# Patient Record
Sex: Male | Born: 1960 | Race: White | Hispanic: No | Smoking: Former smoker
Health system: Southern US, Community
[De-identification: ages and names within clinical notes are randomized; demographics above are authoritative.]

## PROBLEM LIST (undated history)

## (undated) DIAGNOSIS — F1020 Alcohol dependence, uncomplicated: Secondary | ICD-10-CM

## (undated) DIAGNOSIS — F419 Anxiety disorder, unspecified: Secondary | ICD-10-CM

## (undated) DIAGNOSIS — J40 Bronchitis, not specified as acute or chronic: Secondary | ICD-10-CM

## (undated) DIAGNOSIS — I10 Essential (primary) hypertension: Secondary | ICD-10-CM

## (undated) DIAGNOSIS — F32A Depression, unspecified: Secondary | ICD-10-CM

## (undated) DIAGNOSIS — F329 Major depressive disorder, single episode, unspecified: Secondary | ICD-10-CM

## (undated) DIAGNOSIS — E785 Hyperlipidemia, unspecified: Secondary | ICD-10-CM

## (undated) HISTORY — PX: FRACTURE SURGERY: SHX138

## (undated) HISTORY — PX: ORIF PROXIMAL TIBIAL PLATEAU FRACTURE: SUR953

## (undated) HISTORY — PX: JOINT REPLACEMENT: SHX530

---
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
## 2007-08-02 ENCOUNTER — Inpatient Hospital Stay (HOSPITAL_COMMUNITY): Admission: EM | Admit: 2007-08-02 | Discharge: 2007-08-11 | Payer: Self-pay | Admitting: Emergency Medicine

## 2007-08-21 ENCOUNTER — Inpatient Hospital Stay (HOSPITAL_COMMUNITY): Admission: RE | Admit: 2007-08-21 | Discharge: 2007-08-23 | Payer: Self-pay | Admitting: Orthopedic Surgery

## 2007-09-12 ENCOUNTER — Encounter: Payer: Self-pay | Admitting: Orthopedic Surgery

## 2007-09-23 ENCOUNTER — Encounter: Payer: Self-pay | Admitting: Orthopedic Surgery

## 2007-10-21 ENCOUNTER — Encounter: Payer: Self-pay | Admitting: Orthopedic Surgery

## 2007-11-21 ENCOUNTER — Encounter: Payer: Self-pay | Admitting: Orthopedic Surgery
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
## 2011-01-04 NOTE — Op Note (Signed)
NAMEZACARY, BAUER             ACCOUNT NO.:  [id number]   MEDICAL RECORD NO.:  [id number]          PATIENT TYPE:  INP   LOCATION:  5030                         FACILITY:  [facility]   PHYSICIAN:  Doralee Albino. Carola Frost, M.D. DATE OF BIRTH:  October 27, 1960   DATE OF PROCEDURE:  08/09/2007  DATE OF DISCHARGE:                               OPERATIVE REPORT   PREOPERATIVE DIAGNOSIS:  Open left leg fasciotomy.   POSTOPERATIVE DIAGNOSIS:  Open left leg fasciotomy.   PROCEDURE:  Delayed layered closure of left leg wound, 18 cm.   SURGEON:  Doralee Albino. Carola Frost, M.D.   ASSISTANT:  Abel Presto, P.A.-C.   ANESTHESIA:  General.   COMPLICATIONS:  None.   ESTIMATED BLOOD LOSS:  Minimal.   DRAINS:  None.   DISPOSITION:  To the PACU.   CONDITION:  Stable.   INDICATIONS FOR PROCEDURE:  Mr. Jacquelyn Antony is well known to the  service and is status post spanning external fixation and compartment  release for a severe bicondylar tibial plateau fracture associated with  compartment syndrome.  He has undergone serial VAC treatment and wound  closure as his soft tissue have allowed.  He now returns to the OR for  possible definitive closure of his lateral fasciotomy.  We discussed,  preoperatively, the risks and benefits of the surgery including the  possibility of recurrent compartment syndrome, wound breakdown,  infection, nerve or vessel injury, need for further surgery among  others.  The patient understands these and wished to proceed.  He has  been on Lovenox for DVT prophylaxis since the injury.   DESCRIPTION OF PROCEDURE:  Mr. Santillana was administered preoperative  antibiotics and taken to the operating room where general anesthesia was  induced.  His left lower extremity was prepped and draped in the usual  sterile fashion.  The soft tissues did seem to have reduced swelling and  increased pliability.  We removed his old retention sutures, thoroughly  irrigated, no significant debridement was  required.  We then performed a  standard layered closure using 2-0 Vicryl and 3-0 simple nylon sutures.  A sterile gently compressive dressing was then applied.  The patient was  awakened from anesthesia and transported to the PACU in stable  condition.   PROGNOSIS:  Mr. Cropp sustained a severe injury to his left knee and  soft tissue envelope.  He still requires definitive internal fixation of  his fractures.  We will need to wait for an adequate period of soft  tissue swelling resolution before we can safely proceed.  At this time,  his skin does not wrinkle adequately to allow for safe  reconstruction.  We plan to see him back in one week to reassess and we  are hopeful to get him scheduled for definitive management of these  fractures.  He will remain on DVT prophylaxis with Lovenox pending  definitive fixation.      Doralee Albino. Carola Frost, M.D.  Electronically Signed     MHH/MEDQ  D:  08/09/2007  T:  08/09/2007  Job:  [id number]
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
## 2011-01-04 NOTE — Discharge Summary (Signed)
Jorge Vega, Jorge Vega NO.:  [id number]   MEDICAL RECORD NO.:  [id number]          PATIENT TYPE:  INP   LOCATION:  5030                         FACILITY:  [facility]   PHYSICIAN:  Mearl Latin, PA       DATE OF BIRTH:  [birth year]   DATE OF ADMISSION:  08/01/2007  DATE OF DISCHARGE:                               DISCHARGE SUMMARY   ADDENDUM:  [name] will follow up in the office in approximately one week, we  anticipate August 20, 2007.  We will reassess his soft tissue and  determine whether it is sufficiently healed to proceed with definitive  internal fixation of his left bicondylar tibial plateau fracture.  He  has been given prescriptions for pain which include Percocet and  oxycodone, in addition to the aforementioned Duricef for antibiosis and  Lovenox for DVT prophylaxis.  In addition, he is also given Robaxin for  muscle spasms.  Should the patient have any questions at any time or any  issues arise, he will contact the office immediately, and a card with  the office address and number has been given to the patient in addition  to wound care instruction sheet.      Mearl Latin, PA     KWP/MEDQ  D:  08/10/2007  T:  08/10/2007  Job:  [id number]
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
## 2011-01-04 NOTE — Op Note (Signed)
Jorge Vega, Jorge Vega             ACCOUNT NO.:  [id number]   MEDICAL RECORD NO.:  [id number]          PATIENT TYPE:  INP   LOCATION:  5030                         FACILITY:  [facility]   PHYSICIAN:  Doralee Albino. Carola Frost, M.D. DATE OF BIRTH:  September 29, 1960   DATE OF PROCEDURE:  08/06/2007  DATE OF DISCHARGE:                               OPERATIVE REPORT   PREOPERATIVE DIAGNOSIS:  Right open lateral fasciotomy.   POSTOPERATIVE DIAGNOSIS:  Right open lateral fasciotomy.   PROCEDURE:  1. Layered closure of 6 cm of wounds.  2. Application of Wound-Vac, 18 x 3 cm.   SURGEON:  Doralee Albino. Carola Frost, M.D.   ASSISTANT:  Mearl Latin, [place]   ANESTHESIA:  General.   DRAINS:  One medium Hemovac.   DISPOSITION:  PACU.   CONDITION:  Stable.   BRIEF SUMMARY OF INDICATIONS FOR PROCEDURE:  Jorge Vega is a 50 year old male status post severe bicondylar tibial  plateau fracture with compartment syndrome, who has been treated with  delayed primary closure of the medial fasciotomy and now returns to the  OR for possible closure of the lateral side versus partial closure  reapplication of wound vac.  The patient also has an associated foot  drop.  We discussed preoperatively the risks and benefits of surgery  including possibility of failure to close, need for split-thickness skin  grafting, need for subsequent procedures and others.  After full  discussion the patient wished to proceed, understanding these risks also  to include infection, nerve and vessel injury, DVT, PE and others.   DESCRIPTION OF PROCEDURE:  Jorge Vega was taken to the operating room  where general anesthesia was induced.  He did receive preoperative  antibiotics consisting of 2 grams of Ancef.  We thoroughly irrigated and  placed simple nylon suture into a posterior wound that appeared healthy  and without surrounding erythema.  It was closed loosely to allow for  adequate egress and drainage.  This wound was approximately 3 cm.   With  regard to the lateral fasciotomy, we also evacuated some old hematoma  and thoroughly irrigated.  We did look to see if the deep peroneal nerve  was readily visible and perhaps had been injured during the antecedent  anterior fasciotomy.  We did not find any evidence of injury on  evaluation of the anterior compartment.  The fascia had been split where  it could directly visualized, there was no deep penetration of the  muscle, and the deep peroneal nerve branch was not visible.  The muscle  remained pink, healthy in appearance, and contractile.  The superficial  peroneal nerve was again visualized and was definitely intact throughout  its course along the septum.  After irrigation, we attempted closure  beginning at the ends using 2-0 Vicryl.  We were only able to close  about 3 cm total, 1.5 proximally and 1.5 distally.  We did however place  multiple far near, near far sutures with 2-0 nylon to dramatically close  this space, still leaving a gap of 3 cm which remained about 18 cm in  length.  We then placed a  layer of Mepitel and the wound vac over this.  We also placed Mepitel over the other wound and blisters, and then  applied a gently compressive sterile dressing followed by the footplate  bringing the patient's ankle and great toe to neutral.  The patient was  awakened from anesthesia and transferred PACU in stable condition.   PROGNOSIS:  Jorge Vega will need to return to the OR perhaps at the end  of the week to possibly progress toward closure and/or apply a split-  thickness skin grafting.  He may be a candidate for further vac changes  with eventual closure next week as well.  This will certainly depend  upon his soft tissue mobility and edema resolution.  He remains at  increased risk for infection.  We will follow his nerve expectantly at  this time.      Doralee Albino. Carola Frost, M.D.  Electronically Signed     MHH/MEDQ  D:  08/07/2007  T:  08/07/2007  Job:   [id number]
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
## 2011-05-12 LAB — CBC
RBC: 3.29 — ABNORMAL LOW
RDW: 13.5
WBC: 8.9

## 2011-05-12 LAB — BASIC METABOLIC PANEL WITH GFR
BUN: 8
CO2: 30
Calcium: 9.3
Chloride: 103
Creatinine, Ser: 0.74
GFR calc non Af Amer: >60
Glucose, Bld: 101 — ABNORMAL HIGH
Potassium: 4
Sodium: 138

## 2011-05-27 LAB — BASIC METABOLIC PANEL
CO2: 30
Calcium: 8.2 — ABNORMAL LOW
Calcium: 9
Creatinine, Ser: 0.82
Creatinine, Ser: 0.87
GFR calc Af Amer: >60
GFR calc non Af Amer: >60
GFR calc non Af Amer: >60
Potassium: 3.2 — ABNORMAL LOW
Sodium: 134 — ABNORMAL LOW

## 2011-05-27 LAB — COMPREHENSIVE METABOLIC PANEL
AST: 42 — ABNORMAL HIGH
Albumin: 3.7
BUN: 12
Calcium: 9.3
Chloride: 104
Creatinine, Ser: 0.84
GFR calc Af Amer: >60
Total Protein: 6.7

## 2011-05-27 LAB — CBC
HCT: 26.1 — ABNORMAL LOW
HCT: 33.9 — ABNORMAL LOW
Hemoglobin: 9 — ABNORMAL LOW
MCHC: 33.6
MCHC: 34.6
MCV: 88.6
MCV: 89.2
Platelets: 257
Platelets: 527 — ABNORMAL HIGH
RBC: 2.93 — ABNORMAL LOW
RBC: 3.38 — ABNORMAL LOW
RDW: 12
RDW: 13.1
RDW: 13.4
WBC: 7.2
WBC: 8.2

## 2011-05-27 LAB — TYPE AND SCREEN
ABO/RH(D): O POS
Antibody Screen: NEGATIVE

## 2011-05-27 LAB — HEMOGLOBIN AND HEMATOCRIT, BLOOD
HCT: 25.9 — ABNORMAL LOW
HCT: 26.5 — ABNORMAL LOW
Hemoglobin: 9.1 — ABNORMAL LOW
Hemoglobin: 9.2 — ABNORMAL LOW

## 2011-05-27 LAB — DIFFERENTIAL
Basophils Absolute: 0.1
Eosinophils Relative: 2
Lymphocytes Relative: 29
Lymphs Abs: 2.1
Monocytes Absolute: 0.7
Monocytes Relative: 9
Neutro Abs: 4.2

## 2011-05-27 LAB — APTT: aPTT: 31

## 2011-05-30 LAB — CBC
HCT: 27.9 — ABNORMAL LOW
HCT: 37.3 — ABNORMAL LOW
Hemoglobin: 12.9 — ABNORMAL LOW
Hemoglobin: 9.7 — ABNORMAL LOW
MCHC: 34.9
MCV: 90.1
Platelets: 207
Platelets: 212
RBC: 4.19 — ABNORMAL LOW
RDW: 12.2
RDW: 12.6
WBC: 9.5

## 2011-05-30 LAB — URINALYSIS, ROUTINE W REFLEX MICROSCOPIC
Bilirubin Urine: NEGATIVE
Hgb urine dipstick: NEGATIVE
Ketones, ur: >80 — AB
Protein, ur: NEGATIVE
Urobilinogen, UA: 0.2

## 2011-05-30 LAB — COMPREHENSIVE METABOLIC PANEL
ALT: 15
AST: 29
AST: 30
Albumin: 3.4 — ABNORMAL LOW
BUN: 3 — ABNORMAL LOW
CO2: 28
Calcium: 8.2 — ABNORMAL LOW
Chloride: 103
Chloride: 103
Creatinine, Ser: 1.03
GFR calc Af Amer: >60
GFR calc Af Amer: >60
GFR calc non Af Amer: >60
Glucose, Bld: 127 — ABNORMAL HIGH
Potassium: 3.7
Potassium: 3.9
Sodium: 137
Sodium: 138
Sodium: 139
Total Bilirubin: 0.7
Total Bilirubin: 0.8
Total Protein: 6.1

## 2011-05-30 LAB — TYPE AND SCREEN: Antibody Screen: NEGATIVE

## 2011-05-30 LAB — BASIC METABOLIC PANEL
CO2: 23
Calcium: 8.9
GFR calc Af Amer: >60
GFR calc non Af Amer: >60
Potassium: 3.9
Sodium: 136

## 2011-05-30 LAB — HEMOGLOBIN AND HEMATOCRIT, BLOOD
HCT: 34.5 — ABNORMAL LOW
HCT: 35.9 — ABNORMAL LOW
Hemoglobin: 12 — ABNORMAL LOW
Hemoglobin: 12.3 — ABNORMAL LOW

## 2011-05-30 LAB — PROTIME-INR
INR: 1
Prothrombin Time: 13.7

## 2011-05-30 LAB — APTT: aPTT: 29

## 2012-03-23 ENCOUNTER — Ambulatory Visit: Payer: Self-pay | Admitting: Internal Medicine

## 2012-06-20 ENCOUNTER — Emergency Department (HOSPITAL_BASED_OUTPATIENT_CLINIC_OR_DEPARTMENT_OTHER)

## 2012-06-20 ENCOUNTER — Encounter (HOSPITAL_BASED_OUTPATIENT_CLINIC_OR_DEPARTMENT_OTHER): Payer: Self-pay | Admitting: *Deleted

## 2012-06-20 ENCOUNTER — Emergency Department (HOSPITAL_BASED_OUTPATIENT_CLINIC_OR_DEPARTMENT_OTHER)
Admission: EM | Admit: 2012-06-20 | Discharge: 2012-06-20 | Disposition: A | Discharge status: Home / Self Care | Attending: Emergency Medicine | Admitting: Emergency Medicine

## 2012-06-20 DIAGNOSIS — Z9660 Presence of unspecified orthopedic joint implant: Secondary | ICD-10-CM | POA: Insufficient documentation

## 2012-06-20 DIAGNOSIS — Y9269 Other specified industrial and construction area as the place of occurrence of the external cause: Secondary | ICD-10-CM | POA: Insufficient documentation

## 2012-06-20 DIAGNOSIS — S8990XA Unspecified injury of unspecified lower leg, initial encounter: Secondary | ICD-10-CM | POA: Insufficient documentation

## 2012-06-20 DIAGNOSIS — E785 Hyperlipidemia, unspecified: Secondary | ICD-10-CM | POA: Insufficient documentation

## 2012-06-20 DIAGNOSIS — W010XXA Fall on same level from slipping, tripping and stumbling without subsequent striking against object, initial encounter: Secondary | ICD-10-CM | POA: Insufficient documentation

## 2012-06-20 DIAGNOSIS — Y939 Activity, unspecified: Secondary | ICD-10-CM | POA: Insufficient documentation

## 2012-06-20 DIAGNOSIS — I10 Essential (primary) hypertension: Secondary | ICD-10-CM | POA: Insufficient documentation

## 2012-06-20 DIAGNOSIS — S62113A Displaced fracture of triquetrum [cuneiform] bone, unspecified wrist, initial encounter for closed fracture: Secondary | ICD-10-CM | POA: Insufficient documentation

## 2012-06-20 DIAGNOSIS — S99929A Unspecified injury of unspecified foot, initial encounter: Secondary | ICD-10-CM | POA: Insufficient documentation

## 2012-06-20 HISTORY — DX: Hyperlipidemia, unspecified: E78.5

## 2012-06-20 HISTORY — DX: Essential (primary) hypertension: I10

## 2012-06-20 MED ORDER — HYDROCODONE-ACETAMINOPHEN 5-325 MG PO TABS: 2.0000 | ORAL_TABLET | ORAL | Status: DC | PRN

## 2012-06-20 MED ORDER — ACETAMINOPHEN 500 MG PO TABS
1000.0000 mg | ORAL_TABLET | Freq: Once | ORAL | Status: AC
  Administered 2012-06-20: 1000 mg via ORAL
  Filled 2012-06-20: qty 2

## 2012-06-20 NOTE — ED Provider Notes (Signed)
History     CSN: [id number]  Arrival date & time 06/20/12  1348   First MD Initiated Contact with Patient 06/20/12 1407      Chief Complaint  Patient presents with  . Fall    (Consider location/radiation/quality/duration/timing/severity/associated sxs/prior treatment) HPI Comments: Patient is a 51 year old male who presents with left wrist pain and right knee injury after falling on a tile floor earlier today. The patient reports tripping over something on the ground causing his fall. He denies head injury or LOC. Patient currently complaining of only left wrist pain. The pain started immediately after his fall as he braced his fall with his left hand. The pain is throbbing and severe and localized to his left wrist without radiation. Patient reports associated swelling. Patient did not try anything for pain relief. Nothing makes the pain better. Wrist movement makes the pain worse. Patient denies headache, visual changes, chest pain, SOB, abdominal pain, NVD, numbness/tingling of hand, bruising, wound, pallor/coolness of affected extremity.   Patient is a 51 y.o. male presenting with fall.  Fall    Past Medical History  Diagnosis Date  . Hypertension   . Hyperlipemia     Past Surgical History  Procedure Date  . Joint replacement     History reviewed. No pertinent family history.  History  Substance Use Topics  . Smoking status: Never Smoker   . Smokeless tobacco: Not on file  . Alcohol Use: No      Review of Systems  Musculoskeletal: Positive for joint swelling and arthralgias.  All other systems reviewed and are negative.    Allergies  Review of patient's allergies indicates no known allergies.  Home Medications  No current outpatient prescriptions on file.  BP 115/81  Pulse 84  Temp 99.1 F (37.3 C) (Oral)  Resp 16  Ht 6\' 1"  (1.854 m)  Wt 240 lb (108.863 kg)  BMI 31.66 kg/m2  SpO2 100%  Physical Exam  Nursing note and vitals  reviewed. Constitutional: He is oriented to person, place, and time. He appears well-developed and well-nourished. No distress.  HENT:  Head: Normocephalic and atraumatic.  Eyes: Conjunctivae normal and EOM are normal. Pupils are equal, round, and reactive to light. No scleral icterus.  Neck: Normal range of motion. Neck supple.  Cardiovascular: Normal rate and regular rhythm.  Exam reveals no gallop and no friction rub.   No murmur heard. Pulmonary/Chest: Effort normal and breath sounds normal. He has no wheezes. He has no rales. He exhibits no tenderness.  Abdominal: Soft. There is no tenderness.  Musculoskeletal: Normal range of motion.       Mild edema and tenderness to palpation over ulnar aspect of left wrist. ROM of left wrist limited due to pain. No open wound, obvious deformity, bruising noted. Right knee has full ROM without deformity, edema, open wound. No tenderness to palpation.   Neurological: He is alert and oriented to person, place, and time. Coordination normal.       Strength and sensation equal and intact bilaterally. Speech is goal-oriented. Moves limbs without ataxia.   Skin: Skin is warm and dry. He is not diaphoretic.  Psychiatric: He has a normal mood and affect. His behavior is normal.    ED Course  Procedures (including critical care time)  Labs Reviewed - No data to display Dg Wrist Complete Left  06/20/2012  *RADIOLOGY REPORT*  Clinical Data: Larey Seat.  Pain.  LEFT WRIST - COMPLETE 3+ VIEW  Comparison: None.  Findings: There is  a triquetral fracture with fragments evident dorsal to the carpus on the lateral view.  No other regional fracture.  IMPRESSION: Triquetral fracture.   Original Report Authenticated By: Thomasenia Sales, M.D.    Dg Knee Complete 4 Views Right  06/20/2012  *RADIOLOGY REPORT*  Clinical Data: Larey Seat.  Pain.  RIGHT KNEE - COMPLETE 4+ VIEW  Comparison: None.  Findings: There may be a small joint effusion.  No visible fracture.  No significant  degenerative change.  IMPRESSION: Question small joint effusion.  Otherwise negative.   Original Report Authenticated By: Thomasenia Sales, M.D.      1. Triquetral fracture       MDM  3:09 PM Patient's xrays show a left wrist triquetral fracture. No signs of neurovascular compromise. Associated swelling. Patient will have tylenol for pain and splint applied here. Patient will have Norco prescription and follow up with Dr. Melvyn Novas of [facility]. No further evaluation needed here at this time.         Emilia Beck, PA-C 06/20/12 1536

## 2012-06-20 NOTE — ED Notes (Signed)
Pt c/o fall from standing landing on tile floor injuring left wrist and right knee

## 2012-06-21 NOTE — ED Provider Notes (Signed)
Medical screening examination/treatment/procedure(s) were performed by non-physician practitioner and as supervising physician I was immediately available for consultation/collaboration.  Geoffery Lyons, MD 06/21/12 [phone]

## 2012-06-26 ENCOUNTER — Ambulatory Visit: Payer: Self-pay | Admitting: Internal Medicine

## 2012-08-08 LAB — CBC
MCH: 29.6 pg (ref 26.0–34.0)
MCHC: 34.6 g/dL (ref 32.0–36.0)
Platelet: 261 10*3/uL (ref 150–440)
RDW: 12.8 % (ref 11.5–14.5)
WBC: 15 10*3/uL — ABNORMAL HIGH (ref 3.8–10.6)

## 2012-08-08 LAB — CK TOTAL AND CKMB (NOT AT ARMC)
CK, Total: 93 U/L (ref 35–232)
CK-MB: 0.6 ng/mL (ref 0.5–3.6)

## 2012-08-08 LAB — URINALYSIS, COMPLETE
Blood: NEGATIVE
Glucose,UR: NEGATIVE mg/dL (ref 0–75)
Nitrite: NEGATIVE
Ph: 6 (ref 4.5–8.0)
Protein: NEGATIVE
Specific Gravity: 1.012 (ref 1.003–1.030)
WBC UR: NONE SEEN /HPF (ref 0–5)

## 2012-08-08 LAB — COMPREHENSIVE METABOLIC PANEL
Alkaline Phosphatase: 104 U/L (ref 50–136)
Calcium, Total: 8.9 mg/dL (ref 8.5–10.1)
Co2: 20 mmol/L — ABNORMAL LOW (ref 21–32)
Creatinine: 1.17 mg/dL (ref 0.60–1.30)
EGFR (Non-African Amer.): >60
SGOT(AST): 37 U/L (ref 15–37)
SGPT (ALT): 49 U/L (ref 12–78)
Sodium: 121 mmol/L — ABNORMAL LOW (ref 136–145)

## 2012-08-08 LAB — LITHIUM LEVEL: Lithium: 1.03 mmol/L

## 2012-08-09 ENCOUNTER — Inpatient Hospital Stay: Payer: Self-pay | Admitting: Internal Medicine

## 2012-08-09 LAB — ETHANOL
Ethanol %: <0.003 % (ref 0.000–0.080)
Ethanol: <3 mg/dL

## 2012-08-09 LAB — DRUG SCREEN, URINE
Amphetamines, Ur Screen: NEGATIVE (ref ?–1000)
Benzodiazepine, Ur Scrn: NEGATIVE (ref ?–200)
Cannabinoid 50 Ng, Ur ~~LOC~~: NEGATIVE (ref ?–50)
MDMA (Ecstasy)Ur Screen: NEGATIVE (ref ?–500)
Methadone, Ur Screen: NEGATIVE (ref ?–300)

## 2012-08-09 LAB — ELECTROLYTE PANEL
Chloride: 100 mmol/L (ref 98–107)
Co2: 22 mmol/L (ref 21–32)
Sodium: 128 mmol/L — ABNORMAL LOW (ref 136–145)

## 2012-08-09 LAB — MAGNESIUM: Magnesium: 2.4 mg/dL

## 2013-11-26 ENCOUNTER — Ambulatory Visit: Payer: Self-pay | Admitting: Gastroenterology

## 2013-11-27 LAB — PATHOLOGY REPORT

## 2014-05-01 ENCOUNTER — Emergency Department: Payer: Self-pay | Admitting: Emergency Medicine

## 2014-05-01 LAB — DRUG SCREEN, URINE
Amphetamines, Ur Screen: NEGATIVE (ref ?–1000)
BARBITURATES, UR SCREEN: NEGATIVE (ref ?–200)
Benzodiazepine, Ur Scrn: NEGATIVE (ref ?–200)
CANNABINOID 50 NG, UR ~~LOC~~: NEGATIVE (ref ?–50)
Cocaine Metabolite,Ur ~~LOC~~: NEGATIVE (ref ?–300)
MDMA (ECSTASY) UR SCREEN: NEGATIVE (ref ?–500)
METHADONE, UR SCREEN: NEGATIVE (ref ?–300)
OPIATE, UR SCREEN: NEGATIVE (ref ?–300)
PHENCYCLIDINE (PCP) UR S: NEGATIVE (ref ?–25)
TRICYCLIC, UR SCREEN: NEGATIVE (ref ?–1000)

## 2014-05-01 LAB — URINALYSIS, COMPLETE
BILIRUBIN, UR: NEGATIVE
BLOOD: NEGATIVE
Bacteria: NONE SEEN
GLUCOSE, UR: NEGATIVE mg/dL (ref 0–75)
KETONE: NEGATIVE
LEUKOCYTE ESTERASE: NEGATIVE
NITRITE: NEGATIVE
PH: 5 (ref 4.5–8.0)
PROTEIN: NEGATIVE
RBC,UR: <1 /HPF (ref 0–5)
Specific Gravity: 1.011 (ref 1.003–1.030)
Squamous Epithelial: NONE SEEN

## 2014-05-01 LAB — SALICYLATE LEVEL

## 2014-05-01 LAB — COMPREHENSIVE METABOLIC PANEL
ALK PHOS: 65 U/L
ALT: 30 U/L
ANION GAP: 8 (ref 7–16)
Albumin: 4.7 g/dL (ref 3.4–5.0)
BILIRUBIN TOTAL: 0.7 mg/dL (ref 0.2–1.0)
BUN: 17 mg/dL (ref 7–18)
Calcium, Total: 9.6 mg/dL (ref 8.5–10.1)
Chloride: 97 mmol/L — ABNORMAL LOW (ref 98–107)
Co2: 25 mmol/L (ref 21–32)
Creatinine: 0.96 mg/dL (ref 0.60–1.30)
EGFR (African American): >60
Glucose: 108 mg/dL — ABNORMAL HIGH (ref 65–99)
Osmolality: 263 (ref 275–301)
Potassium: 3.9 mmol/L (ref 3.5–5.1)
SGOT(AST): 34 U/L (ref 15–37)
Sodium: 130 mmol/L — ABNORMAL LOW (ref 136–145)
Total Protein: 9 g/dL — ABNORMAL HIGH (ref 6.4–8.2)

## 2014-05-01 LAB — CBC
HCT: 38.8 % — ABNORMAL LOW (ref 40.0–52.0)
HGB: 12.8 g/dL — ABNORMAL LOW (ref 13.0–18.0)
MCH: 29.6 pg (ref 26.0–34.0)
MCHC: 33.1 g/dL (ref 32.0–36.0)
MCV: 90 fL (ref 80–100)
Platelet: 296 10*3/uL (ref 150–440)
RBC: 4.33 10*6/uL — ABNORMAL LOW (ref 4.40–5.90)
RDW: 13.2 % (ref 11.5–14.5)
WBC: 10 10*3/uL (ref 3.8–10.6)

## 2014-05-01 LAB — ACETAMINOPHEN LEVEL: Acetaminophen: <2 ug/mL

## 2014-05-01 LAB — ETHANOL

## 2014-12-09 NOTE — H&P (Signed)
PATIENT NAME:  Jorge MaresBRADLEY, [name] MR#:  [id number] DATE OF BIRTH:  11-04-1960  DATE OF ADMISSION:  08/09/2012  PRIMARY CARE PHYSICIAN: Dr. Yates DecampJohn Walker, III.   REFERRING PHYSICIAN: Dr. Governor Rooksebecca Lord.   CHIEF COMPLAINT: Generalized weakness, ataxia and blurred vision.   HISTORY OF PRESENT ILLNESS: The patient is a 54 year old Caucasian male with history of systemic hypertension, hyperlipidemia and chronic alcoholism. The patient states that 5 days ago on Friday, he started to feel generalized weakness and out of balance when he stood up and walked, associated with some blurring of vision and lack of energy. At that time, he started to cut down drinking alcohol. His usual is 1 pint and a half of vodka every night. His last drink was on Sunday. He had 1 drink. The last couple of days, he could not sleep much at night. He decided to come to the hospital for evaluation. Here, he was found to have orthostatic changes in his heart rate, but no changes in his blood pressure. He is dehydrated and there is evidence of hyponatremia. The patient was admitted for further evaluation and management.   REVIEW OF SYSTEMS:   CONSTITUTIONAL: Denies having any fever. No chills, but reports being fatigued.  EYES: Reports blurring of vision that is occasional, especially upon standing or walking. No double vision.  ENT: No hearing impairment. No sore throat. No dysphagia.  CARDIOVASCULAR: No chest pain. No shortness of breath. No edema. No syncope.  RESPIRATORY: No cough. No sputum production. No shortness of breath. No chest pain.  GASTROINTESTINAL: No abdominal pain. No vomiting. No diarrhea.  GENITOURINARY: No dysuria. No frequency of urination.  MUSCULOSKELETAL: No joint pain or swelling. No muscular pain or swelling.  INTEGUMENTARY: No skin rash. No ulcers.  NEUROLOGY: No focal weakness. No seizure activity. No headache, but he has mild ataxia upon walking.  ENDOCRINE: No polyuria or polydipsia. No heat or cold  intolerance.  HEMATOLOGY: No easy bruisability. No lymph node enlargement.   PAST MEDICAL HISTORY: Systemic hypertension, hyperlipidemia, depression and chronic alcoholism.   SOCIAL HISTORY: He is single and divorced. 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   FAMILY HISTORY: His father had lung cancer. His mother is alive and she is healthy.   ADMISSION MEDICATIONS: He does not know the name of his medications, but from his records he is on lisinopril with hydrochlorothiazide 20/25 once a day, gemfibrozil 600 mg twice a day and B12 1000 mcg injection once a month. He also appears to be on lithium twice a day,  but he does not know the dose and his records does not show the dose of his lithium.   ALLERGIES: No known drug allergies.   PHYSICAL EXAMINATION:  VITAL SIGNS: Blood pressure 114/67, respiratory rate 20, pulse 79, temperature 98.5, oxygen saturation 99%.  GENERAL APPEARANCE: Middle-aged male laying in bed in no acute distress.  HEAD AND NECK: No pallor. No icterus. No cyanosis. Ears: Normal hearing. No discharge. No lesions. Nasal mucosa was normal without lesions. No discharge. Oropharyngeal area showed no ulcers and no oral thrush. Eyes: Normal iris and conjunctivae. Pupils are about 5 mm, round, equal, and reactive to light.  NECK: Supple. Trachea at midline. No thyromegaly. No cervical lymphadenopathy. No masses.  HEART: Normal S1, S2. No S3, S4. No murmur. No gallop. No carotid bruits.  LUNGS: Normal breathing pattern without use of accessory muscles. No rales. No wheezing.  ABDOMEN: Soft without tenderness. No hepatosplenomegaly. No masses. No hernias.  SKIN: No ulcers. No subcutaneous  nodules.  MUSCULOSKELETAL: No joint swelling. No clubbing.  NEUROLOGIC: Cranial nerves II through XII are intact. No focal motor deficit. Coordination movements were normal. Sensation was intact.  PSYCHIATRIC: The patient is alert and oriented x 3. Mood and affect were normal.   LABORATORY  FINDINGS: EKG showed normal sinus rhythm at rate of 72 per minute. CAT scan of the head showed no acute intracranial abnormality. Chest x-ray showed no effusion and no consolidation. Blood workup showed glucose of 100, BUN 40, creatinine 1.1, sodium 121 and potassium 4.3. Normal liver function tests and liver transaminases. Troponin less than 0.02. Lithium level was 1.03. Drug screen was negative. CBC showed elevated white blood cell count at 15,000, hemoglobin 12, hematocrit 35 and platelet count 261. Urinalysis was negative.   ASSESSMENT:  1.  Generalized weakness. 2.  Ataxia.  3.  Dehydration with elevated BUN reaching 40 over a  creatinine of 1.1.  4.  Hyponatremia.  5.  Orthostatic changes in heart rate, but no orthostatic changes in blood pressure.  6.  Systemic hypertension.  7.  Leukocytosis.  8.  Hyperlipidemia.  9.  Depression.  10. Chronic alcoholism.   PLAN: We will start the patient on IV hydration using normal saline to correct his dehydration and postural orthostatic changes. We will follow up on his sodium on repeat basic metabolic profile. We will also repeat CBC tomorrow. Thiamine and multivitamin supplementation. Watch for any withdrawal symptoms. He is right now more than 48 hours since his last drink. I placed him on CIWA protocol.   TIME SPENT IN EVALUATING THIS PATIENT: Took more than 55 minutes.   ____________________________ Carney Corners. Rudene Re, MD amd:aw D: 08/09/2012 02:14:31 ET T: 08/09/2012 08:10:05 ET JOB#: [id number]  cc: Carney Corners. Rudene Re, MD, <Dictator> Zollie Scale MD ELECTRONICALLY SIGNED 08/13/2012 21:15

## 2019-03-19 ENCOUNTER — Telehealth: Payer: Self-pay | Admitting: Urology

## 2019-03-19 NOTE — Telephone Encounter (Signed)
Left patient a message to return call-there is no sooner new patient appointments.

## 2019-03-19 NOTE — Telephone Encounter (Signed)
Pt LMOM and would like to see Dr Bernardo Heater before his appt on 04/18/2019, there are not any New Pt appts available. Please advise.

## 2019-04-11 ENCOUNTER — Other Ambulatory Visit (HOSPITAL_COMMUNITY): Payer: Self-pay | Admitting: Sports Medicine

## 2019-04-11 ENCOUNTER — Other Ambulatory Visit: Payer: Self-pay | Admitting: Sports Medicine

## 2019-04-11 DIAGNOSIS — S46011A Strain of muscle(s) and tendon(s) of the rotator cuff of right shoulder, initial encounter: Secondary | ICD-10-CM

## 2019-04-11 DIAGNOSIS — M25511 Pain in right shoulder: Secondary | ICD-10-CM

## 2019-04-18 ENCOUNTER — Ambulatory Visit: Payer: Self-pay | Admitting: Urology

## 2019-04-19 ENCOUNTER — Other Ambulatory Visit (HOSPITAL_COMMUNITY): Payer: Self-pay | Admitting: Sports Medicine

## 2019-04-19 DIAGNOSIS — S46011A Strain of muscle(s) and tendon(s) of the rotator cuff of right shoulder, initial encounter: Secondary | ICD-10-CM

## 2019-04-19 DIAGNOSIS — M25511 Pain in right shoulder: Secondary | ICD-10-CM

## 2019-05-03 ENCOUNTER — Ambulatory Visit (HOSPITAL_COMMUNITY)
Admission: RE | Admit: 2019-05-03 | Discharge: 2019-05-03 | Disposition: A | Discharge status: Home / Self Care | Payer: Federal, State, Local not specified - PPO | Source: Ambulatory Visit | Attending: Sports Medicine | Admitting: Sports Medicine

## 2019-05-03 ENCOUNTER — Other Ambulatory Visit: Payer: Self-pay

## 2019-05-03 DIAGNOSIS — M25511 Pain in right shoulder: Secondary | ICD-10-CM | POA: Insufficient documentation

## 2019-05-03 DIAGNOSIS — S46011A Strain of muscle(s) and tendon(s) of the rotator cuff of right shoulder, initial encounter: Secondary | ICD-10-CM | POA: Diagnosis present

## 2019-05-10 ENCOUNTER — Inpatient Hospital Stay: Admission: RE | Admit: 2019-05-10 | Payer: Federal, State, Local not specified - PPO | Source: Ambulatory Visit

## 2019-05-14 ENCOUNTER — Other Ambulatory Visit: Admission: RE | Admit: 2019-05-14 | Payer: Federal, State, Local not specified - PPO | Source: Ambulatory Visit

## 2019-05-28 ENCOUNTER — Other Ambulatory Visit: Payer: Self-pay

## 2019-05-28 ENCOUNTER — Other Ambulatory Visit
Admission: RE | Admit: 2019-05-28 | Discharge: 2019-05-28 | Disposition: A | Discharge status: Home / Self Care | Payer: Federal, State, Local not specified - PPO | Source: Ambulatory Visit | Attending: Orthopedic Surgery | Admitting: Orthopedic Surgery

## 2019-05-28 DIAGNOSIS — Z01810 Encounter for preprocedural cardiovascular examination: Secondary | ICD-10-CM

## 2019-05-28 DIAGNOSIS — Z01818 Encounter for other preprocedural examination: Secondary | ICD-10-CM | POA: Diagnosis present

## 2019-05-28 DIAGNOSIS — M75101 Unspecified rotator cuff tear or rupture of right shoulder, not specified as traumatic: Secondary | ICD-10-CM | POA: Diagnosis not present

## 2019-05-28 DIAGNOSIS — Z20828 Contact with and (suspected) exposure to other viral communicable diseases: Secondary | ICD-10-CM | POA: Diagnosis not present

## 2019-05-28 DIAGNOSIS — I10 Essential (primary) hypertension: Secondary | ICD-10-CM | POA: Diagnosis not present

## 2019-05-28 HISTORY — DX: Anxiety disorder, unspecified: F41.9

## 2019-05-28 HISTORY — DX: Depression, unspecified: F32.A

## 2019-05-28 HISTORY — DX: Alcohol dependence, uncomplicated: F10.20

## 2019-05-28 HISTORY — DX: Bronchitis, not specified as acute or chronic: J40

## 2019-05-28 LAB — BASIC METABOLIC PANEL
Anion gap: 12 (ref 5–15)
BUN: 15 mg/dL (ref 6–20)
CO2: 23 mmol/L (ref 22–32)
Calcium: 9.9 mg/dL (ref 8.9–10.3)
Chloride: 102 mmol/L (ref 98–111)
Creatinine, Ser: 0.85 mg/dL (ref 0.61–1.24)
GFR calc Af Amer: >60 mL/min (ref >60)
GFR calc non Af Amer: >60 mL/min (ref >60)
Glucose, Bld: 103 mg/dL — ABNORMAL HIGH (ref 70–99)
Potassium: 4.2 mmol/L (ref 3.5–5.1)
Sodium: 137 mmol/L (ref 135–145)

## 2019-05-28 LAB — CBC
HCT: 39.1 % (ref 39.0–52.0)
Hemoglobin: 13.1 g/dL (ref 13.0–17.0)
MCH: 30.4 pg (ref 26.0–34.0)
MCHC: 33.5 g/dL (ref 30.0–36.0)
MCV: 90.7 fL (ref 80.0–100.0)
Platelets: 310 10*3/uL (ref 150–400)
RBC: 4.31 MIL/uL (ref 4.22–5.81)
RDW: 12.5 % (ref 11.5–15.5)
WBC: 8 10*3/uL (ref 4.0–10.5)
nRBC: 0 % (ref 0.0–0.2)

## 2019-05-28 NOTE — Patient Instructions (Signed)
Your procedure is scheduled on: Friday, May 31, 2019 Report to Day Surgery on the 2nd floor of the [facility]. To find out your arrival time, please call [phone] between 1PM - 3PM on: Thursday, October 8  REMEMBER: Instructions that are not followed completely may result in serious medical risk, up to and including death; or upon the discretion of your surgeon and anesthesiologist your surgery may need to be rescheduled.  Do not eat food after midnight the night before surgery.  No gum chewing, lozengers or hard candies.  You may however, drink CLEAR liquids up to 2 hours before you are scheduled to arrive for your surgery. Do not drink anything within 2 hours of the start of your surgery.  Clear liquids include: - water  - apple juice without pulp - gatorade - black coffee or tea (Do NOT add milk or creamers to the coffee or tea) Do NOT drink anything that is not on this list.  No Alcohol for 24 hours before or after surgery.  No Smoking including e-cigarettes for 24 hours prior to surgery.  No chewable tobacco products for at least 6 hours prior to surgery.  No nicotine patches on the day of surgery.  On the morning of surgery brush your teeth with toothpaste and water, you may rinse your mouth with mouthwash if you wish. Do not swallow any toothpaste or mouthwash.  Notify your doctor if there is any change in your medical condition (cold, fever, infection).  Do not wear jewelry, make-up, hairpins, clips or nail polish.  Do not wear lotions, powders, or perfumes.   Do not shave 48 hours prior to surgery.   Contacts and dentures may not be worn into surgery.  Do not bring valuables to the hospital, including drivers license, insurance or credit cards.  [facility] is not responsible for any belongings or valuables.   TAKE THESE MEDICATIONS THE MORNING OF SURGERY:  1.  Tramadol if needed for pain  Use CHG Soap as directed on instruction sheet.  NOW!  Stop  Anti-inflammatories (NSAIDS) such as Advil, Aleve, Ibuprofen, Motrin, Naproxen, Naprosyn and Aspirin based products such as Excedrin, Goodys Powder, BC Powder. (May take Tylenol or Acetaminophen if needed.)  NOW!  Stop ANY OVER THE COUNTER supplements until after surgery. (May continue Vitamin B, and multivitamin.)  Wear comfortable clothing (specific to your surgery type) to the hospital.  If you are being discharged the day of surgery, you will not be allowed to drive home. You will need a responsible adult to drive you home and stay with you that night.   If you are taking public transportation, you will need to have a responsible adult with you. Please confirm with your physician that it is acceptable to use public transportation.   Please call [phone] if you have any questions about these instructions.

## 2019-05-29 LAB — SARS CORONAVIRUS 2 (TAT 6-24 HRS): SARS Coronavirus 2: NEGATIVE

## 2019-05-30 MED ORDER — DEXTROSE 5 % IV SOLN
3.0000 g | Freq: Once | INTRAVENOUS | Status: AC
  Administered 2019-05-31: 08:00:00 3 g via INTRAVENOUS
  Filled 2019-05-30: qty 3

## 2019-05-31 ENCOUNTER — Encounter: Payer: Self-pay | Admitting: Anesthesiology

## 2019-05-31 ENCOUNTER — Ambulatory Visit: Payer: Federal, State, Local not specified - PPO

## 2019-05-31 ENCOUNTER — Ambulatory Visit
Admission: RE | Admit: 2019-05-31 | Discharge: 2019-05-31 | Disposition: A | Discharge status: Home / Self Care | Payer: Federal, State, Local not specified - PPO | Attending: Orthopedic Surgery | Admitting: Orthopedic Surgery

## 2019-05-31 ENCOUNTER — Ambulatory Visit: Payer: Federal, State, Local not specified - PPO | Admitting: Anesthesiology

## 2019-05-31 ENCOUNTER — Encounter
Admission: RE | Disposition: A | Discharge status: Home / Self Care | Payer: Self-pay | Source: Home / Self Care | Attending: Orthopedic Surgery

## 2019-05-31 ENCOUNTER — Other Ambulatory Visit: Payer: Self-pay

## 2019-05-31 DIAGNOSIS — M25811 Other specified joint disorders, right shoulder: Secondary | ICD-10-CM | POA: Insufficient documentation

## 2019-05-31 DIAGNOSIS — S46011A Strain of muscle(s) and tendon(s) of the rotator cuff of right shoulder, initial encounter: Secondary | ICD-10-CM | POA: Diagnosis not present

## 2019-05-31 DIAGNOSIS — Z87891 Personal history of nicotine dependence: Secondary | ICD-10-CM | POA: Insufficient documentation

## 2019-05-31 DIAGNOSIS — I10 Essential (primary) hypertension: Secondary | ICD-10-CM | POA: Insufficient documentation

## 2019-05-31 DIAGNOSIS — M65811 Other synovitis and tenosynovitis, right shoulder: Secondary | ICD-10-CM | POA: Diagnosis not present

## 2019-05-31 DIAGNOSIS — M19111 Post-traumatic osteoarthritis, right shoulder: Secondary | ICD-10-CM | POA: Diagnosis not present

## 2019-05-31 DIAGNOSIS — M7521 Bicipital tendinitis, right shoulder: Secondary | ICD-10-CM | POA: Diagnosis not present

## 2019-05-31 DIAGNOSIS — S43431A Superior glenoid labrum lesion of right shoulder, initial encounter: Secondary | ICD-10-CM | POA: Diagnosis not present

## 2019-05-31 DIAGNOSIS — W108XXA Fall (on) (from) other stairs and steps, initial encounter: Secondary | ICD-10-CM | POA: Diagnosis not present

## 2019-05-31 DIAGNOSIS — E785 Hyperlipidemia, unspecified: Secondary | ICD-10-CM | POA: Insufficient documentation

## 2019-05-31 HISTORY — PX: SHOULDER ARTHROSCOPY WITH ROTATOR CUFF REPAIR AND SUBACROMIAL DECOMPRESSION: SHX5686

## 2019-05-31 SURGERY — SHOULDER ARTHROSCOPY WITH ROTATOR CUFF REPAIR AND SUBACROMIAL DECOMPRESSION
Anesthesia: General | Site: Shoulder | Laterality: Right

## 2019-05-31 MED ORDER — BUPIVACAINE LIPOSOME 1.3 % IJ SUSP
INTRAMUSCULAR | Status: AC
  Filled 2019-05-31: qty 20

## 2019-05-31 MED ORDER — ASPIRIN EC 325 MG PO TBEC: 325.0000 mg | DELAYED_RELEASE_TABLET | Freq: Every day | ORAL | 0 refills | Status: AC

## 2019-05-31 MED ORDER — EPINEPHRINE PF 1 MG/ML IJ SOLN
INTRAMUSCULAR | Status: AC
  Filled 2019-05-31: qty 1

## 2019-05-31 MED ORDER — BUPIVACAINE HCL (PF) 0.5 % IJ SOLN
INTRAMUSCULAR | Status: AC
  Filled 2019-05-31: qty 10

## 2019-05-31 MED ORDER — LABETALOL HCL 5 MG/ML IV SOLN
INTRAVENOUS | Status: AC
  Filled 2019-05-31: qty 4

## 2019-05-31 MED ORDER — SUGAMMADEX SODIUM 500 MG/5ML IV SOLN
INTRAVENOUS | Status: DC | PRN
  Administered 2019-05-31: 300 mg via INTRAVENOUS

## 2019-05-31 MED ORDER — EPHEDRINE SULFATE 50 MG/ML IJ SOLN
INTRAMUSCULAR | Status: AC
  Filled 2019-05-31: qty 1

## 2019-05-31 MED ORDER — OXYCODONE HCL 5 MG PO TABS: 5.0000 mg | ORAL_TABLET | ORAL | 0 refills | Status: AC | PRN

## 2019-05-31 MED ORDER — FENTANYL CITRATE (PF) 100 MCG/2ML IJ SOLN
50.0000 ug | Freq: Once | INTRAMUSCULAR | Status: AC
  Administered 2019-05-31: 50 ug via INTRAVENOUS

## 2019-05-31 MED ORDER — LIDOCAINE HCL (PF) 1 % IJ SOLN
INTRAMUSCULAR | Status: AC
  Filled 2019-05-31: qty 5

## 2019-05-31 MED ORDER — LIDOCAINE-EPINEPHRINE 1 %-1:100000 IJ SOLN
INTRAMUSCULAR | Status: AC
  Filled 2019-05-31: qty 1

## 2019-05-31 MED ORDER — DEXAMETHASONE SODIUM PHOSPHATE 10 MG/ML IJ SOLN
INTRAMUSCULAR | Status: DC | PRN
  Administered 2019-05-31: 4 mg via INTRAVENOUS

## 2019-05-31 MED ORDER — FENTANYL CITRATE (PF) 100 MCG/2ML IJ SOLN: 25.0000 ug | INTRAMUSCULAR | Status: DC | PRN

## 2019-05-31 MED ORDER — MIDAZOLAM HCL 2 MG/2ML IJ SOLN
1.0000 mg | Freq: Once | INTRAMUSCULAR | Status: AC
  Administered 2019-05-31: 07:00:00 1 mg via INTRAVENOUS

## 2019-05-31 MED ORDER — ACETAMINOPHEN 500 MG PO TABS: 1000.0000 mg | ORAL_TABLET | Freq: Three times a day (TID) | ORAL | 2 refills | Status: AC

## 2019-05-31 MED ORDER — BUPIVACAINE LIPOSOME 1.3 % IJ SUSP
INTRAMUSCULAR | Status: DC | PRN
  Administered 2019-05-31: 13 mL via PERINEURAL
  Administered 2019-05-31: 7 mL via PERINEURAL

## 2019-05-31 MED ORDER — BUPIVACAINE HCL (PF) 0.5 % IJ SOLN
INTRAMUSCULAR | Status: DC | PRN
  Administered 2019-05-31: 5 mL

## 2019-05-31 MED ORDER — LACTATED RINGERS IV SOLN
INTRAVENOUS | Status: DC
  Administered 2019-05-31 (×2): via INTRAVENOUS

## 2019-05-31 MED ORDER — FAMOTIDINE 20 MG PO TABS
ORAL_TABLET | ORAL | Status: AC
  Filled 2019-05-31: qty 1

## 2019-05-31 MED ORDER — LIDOCAINE HCL (PF) 2 % IJ SOLN
INTRAMUSCULAR | Status: AC
  Filled 2019-05-31: qty 10

## 2019-05-31 MED ORDER — MIDAZOLAM HCL 2 MG/2ML IJ SOLN
INTRAMUSCULAR | Status: AC
  Filled 2019-05-31: qty 2

## 2019-05-31 MED ORDER — MIDAZOLAM HCL 5 MG/5ML IJ SOLN
INTRAMUSCULAR | Status: DC | PRN
  Administered 2019-05-31 (×2): 1 mg via INTRAVENOUS

## 2019-05-31 MED ORDER — PROPOFOL 10 MG/ML IV BOLUS
INTRAVENOUS | Status: AC
  Filled 2019-05-31: qty 20

## 2019-05-31 MED ORDER — BUPIVACAINE HCL (PF) 0.5 % IJ SOLN
INTRAMUSCULAR | Status: DC | PRN
  Administered 2019-05-31: 3 mL via PERINEURAL
  Administered 2019-05-31: 7 mL via PERINEURAL

## 2019-05-31 MED ORDER — BUPIVACAINE HCL (PF) 0.5 % IJ SOLN
INTRAMUSCULAR | Status: AC
  Filled 2019-05-31: qty 30

## 2019-05-31 MED ORDER — SEVOFLURANE IN SOLN
RESPIRATORY_TRACT | Status: AC
  Filled 2019-05-31: qty 250

## 2019-05-31 MED ORDER — ROCURONIUM BROMIDE 50 MG/5ML IV SOLN
INTRAVENOUS | Status: AC
  Filled 2019-05-31: qty 1

## 2019-05-31 MED ORDER — OXYCODONE HCL 5 MG PO TABS: 5.0000 mg | ORAL_TABLET | Freq: Once | ORAL | Status: DC | PRN

## 2019-05-31 MED ORDER — FENTANYL CITRATE (PF) 100 MCG/2ML IJ SOLN
INTRAMUSCULAR | Status: AC
  Filled 2019-05-31: qty 2

## 2019-05-31 MED ORDER — FENTANYL CITRATE (PF) 100 MCG/2ML IJ SOLN
INTRAMUSCULAR | Status: DC | PRN
  Administered 2019-05-31: 100 ug via INTRAVENOUS

## 2019-05-31 MED ORDER — LABETALOL HCL 5 MG/ML IV SOLN
INTRAVENOUS | Status: DC | PRN
  Administered 2019-05-31: 2.5 mg via INTRAVENOUS

## 2019-05-31 MED ORDER — ONDANSETRON 4 MG PO TBDP: 4.0000 mg | ORAL_TABLET | Freq: Three times a day (TID) | ORAL | 0 refills | Status: AC | PRN

## 2019-05-31 MED ORDER — ROCURONIUM BROMIDE 100 MG/10ML IV SOLN
INTRAVENOUS | Status: DC | PRN
  Administered 2019-05-31 (×2): 10 mg via INTRAVENOUS
  Administered 2019-05-31: 50 mg via INTRAVENOUS
  Administered 2019-05-31: 20 mg via INTRAVENOUS
  Administered 2019-05-31 (×2): 10 mg via INTRAVENOUS

## 2019-05-31 MED ORDER — LACTATED RINGERS IV SOLN
INTRAVENOUS | Status: DC | PRN
  Administered 2019-05-31: 4 mL

## 2019-05-31 MED ORDER — OXYCODONE HCL 5 MG/5ML PO SOLN: 5.0000 mg | Freq: Once | ORAL | Status: DC | PRN

## 2019-05-31 MED ORDER — PHENYLEPHRINE HCL (PRESSORS) 10 MG/ML IV SOLN
INTRAVENOUS | Status: DC | PRN
  Administered 2019-05-31 (×2): 100 ug via INTRAVENOUS

## 2019-05-31 MED ORDER — FAMOTIDINE 20 MG PO TABS
20.0000 mg | ORAL_TABLET | Freq: Once | ORAL | Status: AC
  Administered 2019-05-31: 06:00:00 20 mg via ORAL

## 2019-05-31 MED ORDER — ONDANSETRON HCL 4 MG/2ML IJ SOLN
INTRAMUSCULAR | Status: DC | PRN
  Administered 2019-05-31: 4 mg via INTRAVENOUS

## 2019-05-31 MED ORDER — ONDANSETRON HCL 4 MG/2ML IJ SOLN
INTRAMUSCULAR | Status: AC
  Filled 2019-05-31: qty 2

## 2019-05-31 MED ORDER — PROPOFOL 10 MG/ML IV BOLUS
INTRAVENOUS | Status: DC | PRN
  Administered 2019-05-31: 200 mg via INTRAVENOUS

## 2019-05-31 MED ORDER — SUCCINYLCHOLINE CHLORIDE 20 MG/ML IJ SOLN
INTRAMUSCULAR | Status: DC | PRN
  Administered 2019-05-31: 200 mg via INTRAVENOUS

## 2019-05-31 SURGICAL SUPPLY — 89 items
ADAPTER IRRIG TUBE 2 SPIKE SOL (ADAPTER) ×6 IMPLANT
ANCHOR 2.3 SP SGL 1.2 XBRAID (Anchor) ×4 IMPLANT
ANCHOR 2.3MM SP SGL 1.2 XBRAID (Anchor) ×2 IMPLANT
ANCHOR BONE REGENETEN (Anchor) ×3 IMPLANT
ANCHOR SUT BIO SW 4.75X19.1 (Anchor) ×9 IMPLANT
ANCHOR TENDON REGENETEN (Staple) ×3 IMPLANT
BLADE OSCILLATING/SAGITTAL (BLADE)
BLADE SW THK.38XMED LNG THN (BLADE) IMPLANT
BNDG ADH 2 X3.75 FABRIC TAN LF (GAUZE/BANDAGES/DRESSINGS) ×3 IMPLANT
BUR BR 5.5 12 FLUTE (BURR) ×3 IMPLANT
BUR RADIUS 4.0X18.5 (BURR) ×3 IMPLANT
CANNULA 5.75X7CM (CANNULA)
CANNULA PART THRD DISP 5.75X7 (CANNULA) IMPLANT
CANNULA PARTIAL THREAD 2X7 (CANNULA) ×3 IMPLANT
CANNULA TWIST IN 8.25X9CM (CANNULA) IMPLANT
CHLORAPREP W/TINT 26 (MISCELLANEOUS) ×3 IMPLANT
CLOSURE WOUND 1/2 X4 (GAUZE/BANDAGES/DRESSINGS)
COOLER POLAR GLACIER W/PUMP (MISCELLANEOUS) ×3 IMPLANT
COVER WAND RF STERILE (DRAPES) ×3 IMPLANT
CRADLE LAMINECT ARM (MISCELLANEOUS) ×3 IMPLANT
DERMABOND ADVANCED (GAUZE/BANDAGES/DRESSINGS)
DERMABOND ADVANCED .7 DNX12 (GAUZE/BANDAGES/DRESSINGS) IMPLANT
DRAPE 3/4 80X56 (DRAPES) ×3 IMPLANT
DRAPE INCISE IOBAN 66X45 STRL (DRAPES) ×3 IMPLANT
DRAPE SPLIT 6X30 W/TAPE (DRAPES) ×6 IMPLANT
DRAPE STERI 35X30 U-POUCH (DRAPES) ×3 IMPLANT
DRAPE U-SHAPE 47X51 STRL (DRAPES) ×6 IMPLANT
DRSG TEGADERM 4X4.75 (GAUZE/BANDAGES/DRESSINGS) ×9 IMPLANT
ELECT REM PT RETURN 9FT ADLT (ELECTROSURGICAL) ×3
ELECTRODE REM PT RTRN 9FT ADLT (ELECTROSURGICAL) ×1 IMPLANT
GAUZE SPONGE 4X4 12PLY STRL (GAUZE/BANDAGES/DRESSINGS) ×3 IMPLANT
GAUZE XEROFORM 1X8 LF (GAUZE/BANDAGES/DRESSINGS) ×3 IMPLANT
GLOVE BIO SURGEON STRL SZ7.5 (GLOVE) ×3 IMPLANT
GLOVE BIOGEL PI IND STRL 8 (GLOVE) ×2 IMPLANT
GLOVE BIOGEL PI INDICATOR 8 (GLOVE) ×4
GLOVE SURG ORTHO 8.0 STRL STRW (GLOVE) ×3 IMPLANT
GLOVE SURG SYN 8.0 (GLOVE) ×3 IMPLANT
GOWN STRL REUS W/ TWL LRG LVL3 (GOWN DISPOSABLE) ×2 IMPLANT
GOWN STRL REUS W/TWL LRG LVL3 (GOWN DISPOSABLE) ×4
GOWN STRL REUS W/TWL XL LVL4 (GOWN DISPOSABLE) ×3 IMPLANT
IMPL REGENETEN MEDIUM (Shoulder) ×1 IMPLANT
IMPLANT REGENETEN MEDIUM (Shoulder) ×3 IMPLANT
IV LACTATED RINGER IRRG 3000ML (IV SOLUTION) ×24
IV LR IRRIG 3000ML ARTHROMATIC (IV SOLUTION) ×12 IMPLANT
KIT STABILIZATION SHOULDER (MISCELLANEOUS) ×3 IMPLANT
KIT SUTURETAK 3.0 INSERT PERC (KITS) IMPLANT
KIT TURNOVER KIT A (KITS) ×3 IMPLANT
MANIFOLD NEPTUNE II (INSTRUMENTS) ×3 IMPLANT
MASK FACE SPIDER DISP (MASK) ×3 IMPLANT
MAT ABSORB  FLUID 56X50 GRAY (MISCELLANEOUS) ×4
MAT ABSORB FLUID 56X50 GRAY (MISCELLANEOUS) ×2 IMPLANT
NDL MAYO CATGUT SZ5 (NEEDLE)
NDL SAFETY ECLIPSE 18X1.5 (NEEDLE) ×1 IMPLANT
NDL SUT 5 .5 CRC TPR PNT MAYO (NEEDLE) IMPLANT
NEEDLE HYPO 18GX1.5 SHARP (NEEDLE) ×2
NEEDLE HYPO 22GX1.5 SAFETY (NEEDLE) ×3 IMPLANT
NEEDLE SCORPION MULTI FIRE (NEEDLE) ×3 IMPLANT
PACK ARTHROSCOPY SHOULDER (MISCELLANEOUS) ×3 IMPLANT
PAD WRAPON POLAR SHDR XLG (MISCELLANEOUS) ×1 IMPLANT
PENCIL SMOKE ULTRAEVAC 22 CON (MISCELLANEOUS) ×3 IMPLANT
SET TUBE SUCT SHAVER OUTFL 24K (TUBING) ×3 IMPLANT
SET TUBE TIP INTRA-ARTICULAR (MISCELLANEOUS) ×3 IMPLANT
SLING ULTRA II M (MISCELLANEOUS) ×3 IMPLANT
STAPLER SKIN PROX 35W (STAPLE) IMPLANT
STRAP SAFETY 5IN WIDE (MISCELLANEOUS) ×3 IMPLANT
STRIP CLOSURE SKIN 1/2X4 (GAUZE/BANDAGES/DRESSINGS) IMPLANT
SUT ETHILON 3-0 (SUTURE) ×3 IMPLANT
SUT FIBERWIRE #2 38 T-5 BLUE (SUTURE) ×3
SUT LASSO 90 DEG CVD (SUTURE) IMPLANT
SUT LASSO 90 DEG SD STR (SUTURE) IMPLANT
SUT MNCRL 4-0 (SUTURE)
SUT MNCRL 4-0 27XMFL (SUTURE)
SUT PROLENE 0 CT 2 (SUTURE) IMPLANT
SUT VIC AB 0 CT1 36 (SUTURE) IMPLANT
SUT VIC AB 2-0 CT2 27 (SUTURE) IMPLANT
SUT VICRYL 3-0 27IN (SUTURE) IMPLANT
SUTURE FIBERWR #2 38 T-5 BLUE (SUTURE) ×1 IMPLANT
SUTURE MNCRL 4-0 27XMF (SUTURE) IMPLANT
SUTURE TAPE FIBERLINK 1.3 LOOP (SUTURE) ×1 IMPLANT
SUTURETAPE FIBERLINK 1.3 LOOP (SUTURE) ×3
SYR 10ML LL (SYRINGE) IMPLANT
SYSTEM FBRTK BICEPS 1.9 DRILL (Anchor) ×3 IMPLANT
TAPE CLOTH 3X10 WHT NS LF (GAUZE/BANDAGES/DRESSINGS) ×3 IMPLANT
TAPE MICROFOAM 4IN (TAPE) ×3 IMPLANT
TUBING ARTHRO INFLOW-ONLY STRL (TUBING) ×3 IMPLANT
TUBING CONNECTING 10 (TUBING) IMPLANT
TUBING CONNECTING 10' (TUBING)
WAND WEREWOLF FLOW 90D (MISCELLANEOUS) ×3 IMPLANT
WRAPON POLAR PAD SHDR XLG (MISCELLANEOUS) ×3

## 2019-05-31 NOTE — Anesthesia Procedure Notes (Addendum)
Procedure Name: Intubation Date/Time: 05/31/2019 7:47 AM Performed by: Babs Sciara, CRNA Pre-anesthesia Checklist: Patient identified, Emergency Drugs available, Suction available, Patient being monitored and Timeout performed Patient Re-evaluated:Patient Re-evaluated prior to induction Oxygen Delivery Method: Circle system utilized Preoxygenation: Pre-oxygenation with 100% oxygen Induction Type: IV induction Ventilation: Mask ventilation without difficulty Laryngoscope Size: McGraph and 4 Grade View: Grade I Tube size: 7.5 mm Number of attempts: 1 Airway Equipment and Method: Stylet Placement Confirmation: ETT inserted through vocal cords under direct vision,  positive ETCO2 and breath sounds checked- equal and bilateral Secured at: 22 (lip, left side) cm Tube secured with: Tape Comments: Initial DL with 4 mac, unable to get clear view of cords d/t mustache, very deep and u shaped epiglottis.  Easy with Meredith Staggers
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
## 2019-05-31 NOTE — Anesthesia Postprocedure Evaluation (Signed)
Anesthesia Post Note  Patient: Verlin Uher.  Procedure(s) Performed: SHOULDER ARTHROSCOPY WITH OPEN ROTATOR CUFF REPAIR AND SUBACROMIAL DECOMPRESSION, BICEPS TENODESIS (Right Shoulder)  Patient location during evaluation: PACU Anesthesia Type: General Level of consciousness: awake and alert Pain management: pain level controlled Vital Signs Assessment: post-procedure vital signs reviewed and stable Respiratory status: spontaneous breathing, nonlabored ventilation, respiratory function stable and patient connected to nasal cannula oxygen Cardiovascular status: blood pressure returned to baseline and stable Postop Assessment: no apparent nausea or vomiting Anesthetic complications: no     Last Vitals:  Vitals:   05/31/19 1220 05/31/19 1222  BP: 111/76 105/73  Pulse: 85 85  Resp: (!) 26 (!) 26  Temp:    SpO2: 96% 96%    Last Pain:  Vitals:   05/31/19 1220  TempSrc:   PainSc: 0-No pain                 Precious Haws Morna Flud

## 2019-05-31 NOTE — Anesthesia Procedure Notes (Signed)
Anesthesia Regional Block: Interscalene brachial plexus block   Pre-Anesthetic Checklist: ,, timeout performed, Correct Patient, Correct Site, Correct Laterality, Correct Procedure, Correct Position, site marked, Risks and benefits discussed,  Surgical consent,  Pre-op evaluation,  At surgeon's request and post-op pain management  Laterality: Upper and Right  Prep: chloraprep       Needles:  Injection technique: Single-shot  Needle Type: Stimiplex     Needle Length: 5cm  Needle Gauge: 22     Additional Needles:   Procedures:,,,, ultrasound used (permanent image in chart),,,,  Narrative:  Start time: 05/31/2019 7:12 AM End time: 05/31/2019 7:15 AM Injection made incrementally with aspirations every 5 mL.  Performed by: Personally  Anesthesiologist: August Longest, Precious Haws, MD  Additional Notes: Patient consented for risk and benefits of nerve block including but not limited to nerve damage, failed block, bleeding and infection.  Patient voiced understanding.  Functioning IV was confirmed and monitors were applied.  A 35mm 22ga Stimuplex needle was used. Sterile prep,hand hygiene and sterile gloves were used.  Minimal sedation used for procedure.  No paresthesia endorsed by patient during the procedure.  Negative aspiration and negative test dose prior to incremental administration of local anesthetic. The patient tolerated the procedure well with no immediate complications.

## 2019-05-31 NOTE — Anesthesia Post-op Follow-up Note (Signed)
Anesthesia QCDR form completed.        

## 2019-05-31 NOTE — Op Note (Addendum)
SURGERY DATE: 05/31/2019  PRE-OP DIAGNOSIS:  1. Right rotator cuff tear (subscapularis, supraspinatus, anterior infraspinatus) 2. Right acromioclavicular joint osteoarthritis 3. Right subacromial impingement 4. Right biceps tendinopathy  POST-OP DIAGNOSIS: 1. Right rotator cuff tear (subscapularis, supraspinatus, anterior infraspinatus) 2. Right acromioclavicular joint osteoarthritis 3. Right subacromial impingement 4. Right biceps tendinopathy  PROCEDURES:  1. Right arthroscopic rotator cuff repair (subscapularis) 2. Right mini-open rotator cuff repair (supraspinatus and infraspinatus) with augmentation with Regeneten patch 3. Right open biceps tenodesis 4. Right arthroscopic extensive debridement of shoulder (glenohumeral and subacromial spaces) 5. Right arthroscopic subacromial decompression  SURGEON: Cato Mulligan, MD  ASSISTANT: Anitra Lauth, PA  ANESTHESIA: Gen with Exparil interscalene block  ESTIMATED BLOOD LOSS: 25cc  DRAINS:  none  TOTAL IV FLUIDS: per anesthesia   SPECIMENS: none  IMPLANTS:  - Arthrex 4.68mm SwiveLock x 3 - Arthrex FiberTak Suture Anchor - Double Loaded - Iconix SPEED double loaded with 1.2 and 2.46mm tape x 2 - [employer] Regeneten Patch (size medium) with associated tendon and bone staples   OPERATIVE FINDINGS:  Examination under anesthesia: A careful examination under anesthesia was performed.  Passive range of motion was: FF: 150; ER at side: 45; ER in abduction: 90; IR in abduction: 50.  Anterior load shift: NT.  Posterior load shift: NT.  Sulcus in neutral: NT.  Sulcus in ER: NT.    Intra-operative findings: A thorough arthroscopic examination of the shoulder was performed.  The findings are: 1. Biceps tendon: Significant tendinopathy 2. Superior labrum: Type II SLAP tear 3. Posterior labrum and capsule: normal 4. Inferior capsule and inferior recess: normal 5. Glenoid cartilage surface: Normal 6. Supraspinatus attachment:  full-thickness tear 7. Posterior rotator cuff attachment: Full-thickness tear of anterior infraspinatus 8. Humeral head articular cartilage: normal 9. Rotator interval: significant synovitis 10: Subscapularis tendon: full-thickness tear of superior fibers with retraction 11. Anterior labrum: degenerative and synovitic 12. IGHL: Normal  OPERATIVE REPORT:   Indications for procedure: Jorge Vega. is a 58 y.o. male with chronic shoulder pain that was significantly worsened approximately 3 months ago when he had a fall after missing a step and landing directly on the shoulder. Since that time, she has failed non-operative management including activity modification and medical management without adequate relief of symptoms. Clinical exam and MRI were suggestive of full-thickness rotator cuff tear including subscapularis, supraspinatus, and infraspinatus tears; biceps tendinopathy and subacromial impingement. Given the full-thickness component of multiple tendons and the patient's continued symptoms including difficulty with lifting arm over his head, we decided to proceed with surgical management. After discussion of risks, benefits, and alternatives to surgery, the patient elected to proceed.   Procedure in detail:  I identified Jorge Vega. in the pre-operative holding area.  I marked the operative shoulder with my initials. I reviewed the risks and benefits of the proposed surgical intervention, and the patient (and/or patient's guardian) wished to proceed.  Anesthesia was then performed with an Exparil interscalene block.  The patient was transferred to the operative suite and placed in the beach chair position.    SCDs were placed on the lower extremities. Appropriate IV antibiotics were administered prior to incision. The operative upper extremity was then prepped and draped in standard fashion. A time out was performed confirming the correct extremity, correct patient, and correct  procedure.   I then created a standard posterior portal with an 11 blade. The glenohumeral joint was easily entered with a blunt trochar and the arthroscope introduced. The  findings of diagnostic arthroscopy are described above.  A standard anterior portal was made.  I debrided degenerative tissue including the synovitic tissue about the rotator interval and IGHL as well as the anterior and superior labrum. I then coagulated the inflamed synovium to obtain hemostasis and reduce the risk of post-operative swelling using an Arthrocare radiofrequency device. The biceps tendon was cut at its insertion on the superior labrum with arthroscopic scissors and then the stump was debrided with an oscillating shaver and ArthroCare wand.  The subscapularis tear was identified.  A superior anterolateral portal was made under needle localization.  A 7 mm cannula was placed.  The comma tissue indicating the superolateral border of the subscapularis was identified readily. The rotator interval tissue was debrided.  Tissue about the subscapularis was released anteriorly, superiorly, and posteriorly to allow for improved mobilization.  The comma tissue was tagged with a FiberWire suture and tension on this allowed for appropriate mobilization of the subscapularis.  The lesser tuberosity footprint was prepared with a combination of electrocautery and an arthroscopic curette.  A Scorpion suture passing device was used to pass a FiberWire in a mattress fashion through the subscapularis tendon.  These were passed from the anterolateral portal, and after passage, the sutures were retrieved from the anterior portal.  Additionally a fiber link suture was passed to the subscapularis and a luggage tag fashion.  These were passed through a 4.75 mm SwiveLock and placed into the lesser tuberosity at the footprint of the subscapularis tendon with the arm in a neutral position.  This appropriately reduced the subscapularis tear.  The arm was  then internally and externally rotated and the subscapularis was noted to move appropriately with rotation.  Next, the arthroscope was then introduced into the subacromial space. A direct lateral portal was created with an 11-blade after spinal needle localization. An extensive subacromial bursectomy was performed using a combination of the shaver and Arthrocare wand. The entire acromial undersurface was exposed and the CA ligament was subperiosteally elevated to expose the anterior acromial hook. A 5.46mm barrel burr was used to create a flat anterior and lateral aspect of the acromion, converting it from a Type 2 to a Type 1 acromion. Care was made to keep the deltoid fascia intact.  The supraspinatus and anterior infraspinatus tear was visualized.  Further tissue superior and inferior to the tear was released.  This tear was unable to be completely mobilized to cover the footprint arthroscopically.  Decision was made to proceed with a mini open approach.  A longitudinal incision from the anterolateral acromion ~6cm in length was made overlying the raphe between the anterior and middle heads of the deltoid.  This incision also incorporated the anterolateral portal.  The raphe was identified and it was incised. The subacromial space was identified. Any remaining bursa was excised. The rotator cuff tear was identified. It was an L-shaped tear with the long limb of the L anterior.   We then turned our attention to the biceps tenodesis. The arm was externally rotated.  The bicipital groove was identified.  A 15 blade was used to make a cut overlying the biceps tendon, and the tendon was removed using a right angle clamp.  There was significant erythema along the length of the biceps tendon.  The base of the bicipital groove was identified and cleared of soft tissue.  A FiberTak anchor was placed in the bicipital groove.  The biceps tendon was held at the appropriate amount of tension.  One  set of sutures was  passed through the biceps anchor with one limb passed in a simple fashion and the second limb passed in a simple plus locking stitch pattern.  This was repeated for the other set of sutures.  This construct allowed for shuttling the biceps tendon down to the bone.  The sutures were tied and cut.  The diseased portion of the proximal biceps was then excised.  The arm was then internally rotated.  The rotator cuff footprint was cleared of soft tissue and the footprint was smoothed and bleeding bone over the footprint was created with a rongeur.  Given the difficulty in fully reducing the rotator cuff, Two Iconix SPEED anchors were placed just medial to the articular margin.  The rotator cuff was held in a reduced position and all limbs of suture were passed appropriately with a scorpion suture passer. Two SwiveLock anchors were placed for the lateral row anchors with one limb of each of the four medial row sutures passed through each anchor.  This allowed for  reapproximation and compression of the rotator cuff over its footprint. The construct was stable with external and internal rotation.  There was an approximately 1 cm area of the footprint that was left uncovered laterally and anteriorly.  Therefore, decision was made to augment the repair with a Regeneten patch.  Patch was deployed over the uncovered footprint.  Appropriate tendon and bone staples were placed to secure the patch to the rotator cuff and the lateral aspect of the footprint.  This was also stable to probing and external and internal rotation.  The wound was thoroughly irrigated.  The deltoid split was closed with 0 Vicryl.  The subdermal layer was closed with 2-0 Vicryl.  The skin was closed with 4-0 Monocryl and Dermabond. The portals were closed with 3-0 Nylon. Xeroform was applied to the incisions. A sterile dressing was applied, followed by a Polar Care sleeve and a SlingShot shoulder immobilizer/sling. The patient was awakened from  anesthesia without difficulty and was transferred to the PACU in stable condition.   Of note, assistance from a PA was essential to performing the surgery. PA assisted with patient positioning, retraction, and instrumentation. The surgery would have been more difficult and had longer operative time without PA assistance.   Additionally, this case had increased complexity compared to standard mini open rotator cuff repair given the inability to fully mobilize the tear to its footprint.  This required more extensive releases and debridement.  Additionally, this required use of the Regeneten patch to fully cover the footprint. Repair of this tear increased surgical time by approximately 30 minutes and increased complexity due to additional rotator cuff preparation, instrumentation, and, use of additional implants, all of which would have otherwise not occurred.    COMPLICATIONS: none  DISPOSITION: plan for discharge home after recovery in PACU   POSTOPERATIVE PLAN: Remain in sling (except hygiene and elbow/wrist/hand RoM exercises as instructed by PT) x 6 weeks and NWB for this time. PT to begin 3-4 days after surgery.  Use large rotator cuff repair rehab protocol with subscapularis repair.  ASA  daily x 2 weeks for DVT ppx.

## 2019-05-31 NOTE — Discharge Instructions (Signed)
Post-Op Instructions - Rotator Cuff Repair ° °1. Bracing: You will wear a shoulder immobilizer or sling for 6 weeks.  ° °2. Driving: No driving for 3 weeks post-op. When driving, do not wear the immobilizer. Ideally, we recommend no driving for 6 weeks while sling is in place as one arm will be immobilized.  ° °3. Activity: No active lifting for 2 months. Wrist, hand, and elbow motion only. Avoid lifting the upper arm away from the body except for hygiene. You are permitted to bend and straighten the elbow passively only (no active elbow motion). You may use your hand and wrist for typing, writing, and managing utensils (cutting food). Do not lift more than a coffee cup for 8 weeks.  When sleeping or resting, inclined positions (recliner chair or wedge pillow) and a pillow under the forearm for support may provide better comfort for up to 4 weeks.  Avoid long distance travel for 4 weeks. ° °Return to normal activities after rotator cuff repair repair normally takes 6 months on average. If rehab goes very well, may be able to do most activities at 4 months, except overhead or contact sports. ° °4. Physical Therapy: Begins 3-4 days after surgery, and proceed 1 time per week for the first 6 weeks, then 1-2 times per week from weeks 6-20 post-op. ° °5. Medications:  °- You will be provided a prescription for narcotic pain medicine. After surgery, take 1-2 narcotic tablets every 4 hours if needed for severe pain.  °- A prescription for anti-nausea medication will be provided in case the narcotic medicine causes nausea - take 1 tablet every 6 hours only if nauseated.   °- Take tylenol 1000 mg (2 Extra Strength tablets or 3 regular strength) every 8 hours for pain.  May decrease or stop tylenol 5 days after surgery if you are having minimal pain. °- Take ASA 325mg/day x 2 weeks to help prevent DVTs/PEs (blood clots).  °- DO NOT take ANY nonsteroidal anti-inflammatory pain medications (Advil, Motrin, Ibuprofen, Aleve,  Naproxen, or Naprosyn). These medicines can inhibit healing of your shoulder repair.  ° ° °If you are taking prescription medication for anxiety, depression, insomnia, muscle spasm, chronic pain, or for attention deficit disorder, you are advised that you are at a higher risk of adverse effects with use of narcotics post-op, including narcotic addiction/dependence, depressed breathing, death. °If you use non-prescribed substances: alcohol, marijuana, cocaine, heroin, methamphetamines, etc., you are at a higher risk of adverse effects with use of narcotics post-op, including narcotic addiction/dependence, depressed breathing, death. °You are advised that taking > 50 morphine milligram equivalents (MME) of narcotic pain medication per day results in twice the risk of overdose or death. For your prescription provided: oxycodone 5 mg - taking more than 6 tablets per day would result in > 50 morphine milligram equivalents (MME) of narcotic pain medication. °Be advised that we will prescribe narcotics short-term, for acute post-operative pain only - 3 weeks for major operations such as shoulder repair/reconstruction surgeries.  ° ° ° °6. Post-Op Appointment: ° °Your first post-op appointment will be 10-14 days post-op. ° °7. Work or School: For most, but not all procedures, we advise staying out of work or school for at least 1 to 2 weeks in order to recover from the stress of surgery and to allow time for healing.  ° °If you need a work or school note this can be provided.  ° °8. Smoking: If you are a smoker, you need to refrain from   smoking in the postoperative period. The nicotine in cigarettes will inhibit healing of your shoulder repair and decrease the chance of successful repair. Similarly, nicotine containing products (gum, patches) should be avoided.  ° °Post-operative Brace: °Apply and remove the brace you received as you were instructed to at the time of fitting and as described in detail as the brace’s  instructions for use indicate.  Wear the brace for the period of time prescribed by your physician.  The brace can be cleaned with soap and water and allowed to air dry only.  Should the brace result in increased pain, decreased feeling (numbness/tingling), increased swelling or an overall worsening of your medical condition, please contact your doctor immediately.  If an emergency situation occurs as a result of wearing the brace after normal business hours, please dial 911 and seek immediate medical attention.  Let your doctor know if you have any further questions about the brace issued to you. °Refer to the shoulder sling instructions for use if you have any questions regarding the correct fit of your shoulder sling.  °[facility] [employer] for Troubleshooting: [phone] ° °Video that illustrates how to properly use a shoulder sling: °"Instructions for Proper Use of an Orthopaedic Sling" °https://www.youtube.com/watch?v=AHZpn_Xo45w ° ° °AMBULATORY SURGERY  °DISCHARGE INSTRUCTIONS ° ° °1) The drugs that you were given will stay in your system until tomorrow so for the next 24 hours you should not: ° °A) Drive an automobile °B) Make any legal decisions °C) Drink any alcoholic beverage ° ° °2) You may resume regular meals tomorrow.  Today it is better to start with liquids and gradually work up to solid foods. ° °You may eat anything you prefer, but it is better to start with liquids, then soup and crackers, and gradually work up to solid foods. ° ° °3) Please notify your doctor immediately if you have any unusual bleeding, trouble breathing, redness and pain at the surgery site, drainage, fever, or pain not relieved by medication. ° ° ° °4) Additional Instructions: ° ° ° ° ° ° ° °Please contact your physician with any problems or Same Day Surgery at [phone], Monday through Friday 6 am to 4 pm, or [place] [facility] [place] [facility] number at [phone]. ° ° °

## 2019-05-31 NOTE — H&P (Signed)
Paper H&P to be scanned into permanent record. H&P reviewed. No significant changes noted.  

## 2019-05-31 NOTE — Transfer of Care (Signed)
Immediate Anesthesia Transfer of Care Note  Patient: Jorge Vega.  Procedure(s) Performed: SHOULDER ARTHROSCOPY WITH OPEN ROTATOR CUFF REPAIR AND SUBACROMIAL DECOMPRESSION, BICEPS TENODESIS (Right Shoulder)  Patient Location: PACU  Anesthesia Type:GA combined with regional for post-op pain  Level of Consciousness: awake, alert  and oriented  Airway & Oxygen Therapy: Patient Spontanous Breathing and Patient connected to face mask oxygen  Post-op Assessment: Report given to RN and Post -op Vital signs reviewed and stable  Post vital signs: Reviewed and stable  Last Vitals:  Vitals Value Taken Time  BP 118/59 05/31/19 1137  Temp 36.3 C 05/31/19 1137  Pulse 79 05/31/19 1141  Resp 30 05/31/19 1141  SpO2 100 % 05/31/19 1141  Vitals shown include unvalidated device data.  Last Pain:  Vitals:   05/31/19 1137  TempSrc:   PainSc: 0-No pain         Complications: No apparent anesthesia complications

## 2019-06-06 ENCOUNTER — Encounter: Payer: Self-pay | Admitting: Orthopedic Surgery

## 2019-06-08 ENCOUNTER — Encounter: Payer: Self-pay | Admitting: Orthopedic Surgery

## 2019-11-10 IMAGING — MR MR SHOULDER*R* W/O CM
5 series · 38 of 40 positions shown · non-contrast
Comparison: None.

CLINICAL DATA: Right shoulder pain for the past month.  No injury.

EXAM:
MRI OF THE RIGHT SHOULDER WITHOUT CONTRAST
TECHNIQUE: Multiplanar, multisequence MR imaging of the shoulder was performed.
No intravenous contrast was administered.

[Series 5: PD fat-sat · axial · right · 4.0mm · 0.36mm/px · z∈[-27,+88]mm · 8 of 25 slices shown (1 of 2)]
[im 1/25]
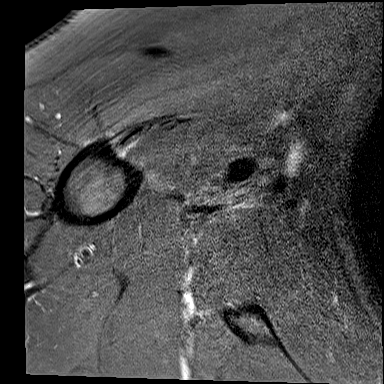
[im 4/25]
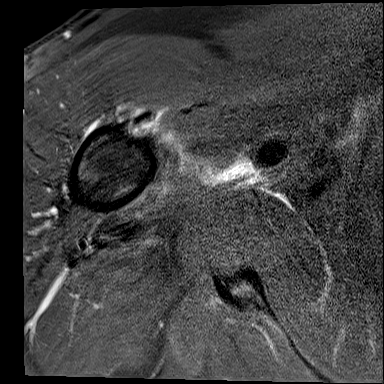
[im 7/25]
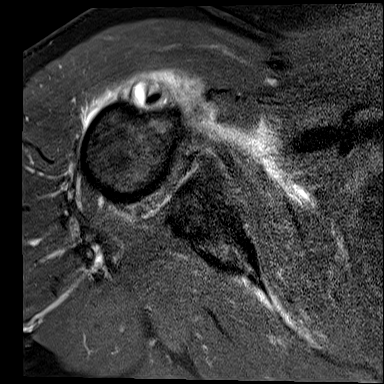
[im 11/25]
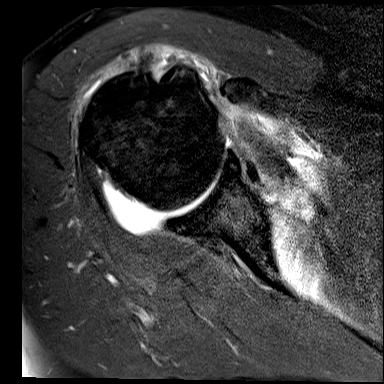
[im 14/25]
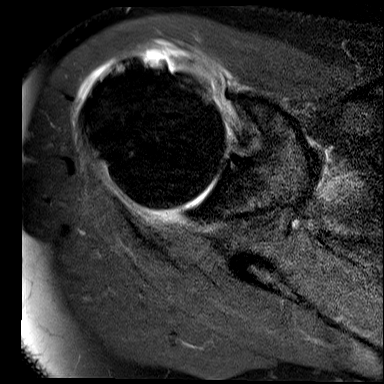
[im 18/25]
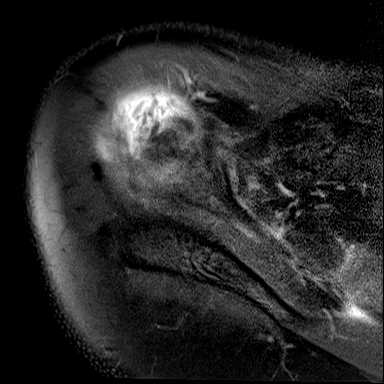
[im 21/25]
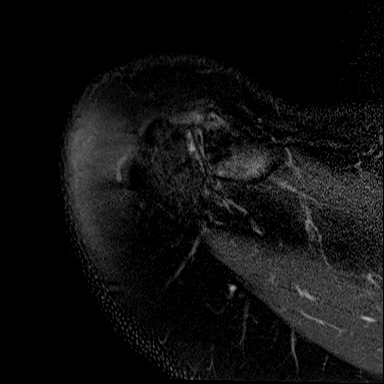
[im 25/25]
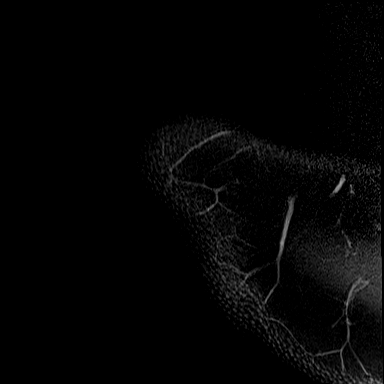

[Series 6: T2 fat-sat · oblique · right · 4.0mm · 0.44mm/px · 8 of 26 slices shown (1 of 2)]
[im 1/26]
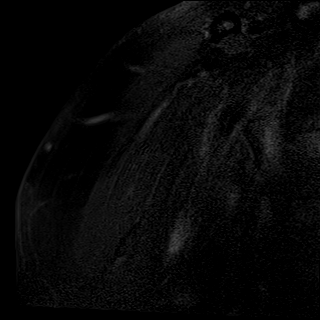
[im 4/26]
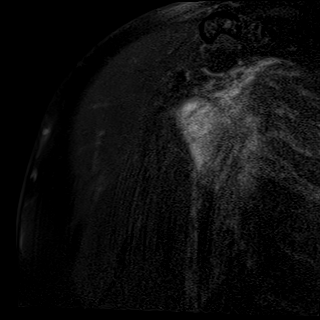
[im 8/26]
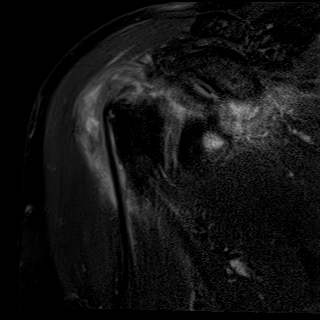
[im 11/26]
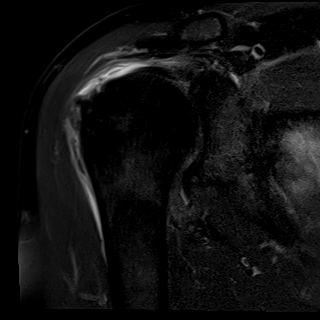
[im 15/26]
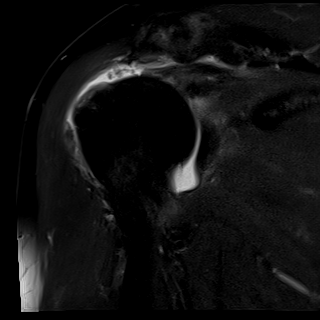
[im 18/26]
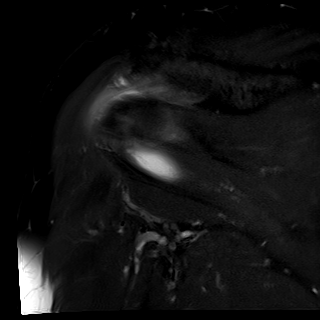
[im 22/26]
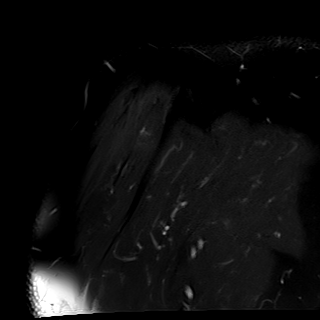
[im 26/26]
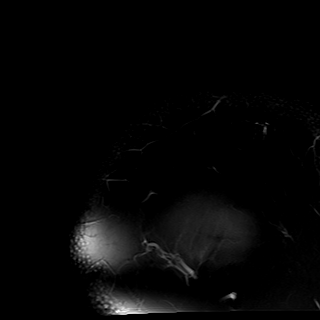

[Series 7: PD fat-sat · oblique · right · 4.0mm · 0.39mm/px · 8 of 26 slices shown (2 of 2)]
[im 1/26]
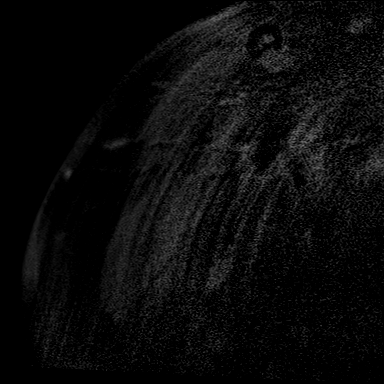
[im 4/26]
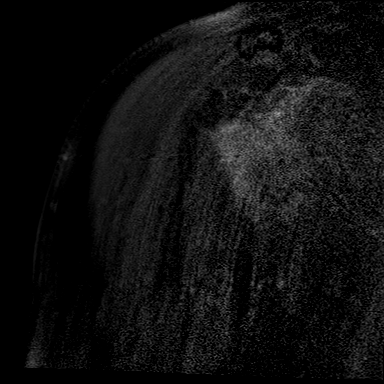
[im 8/26]
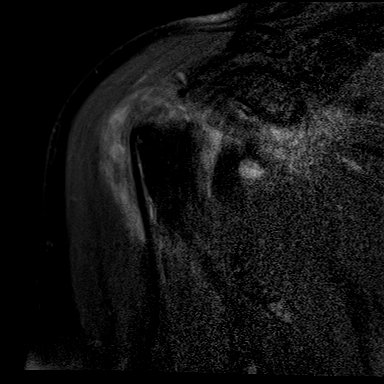
[im 11/26]
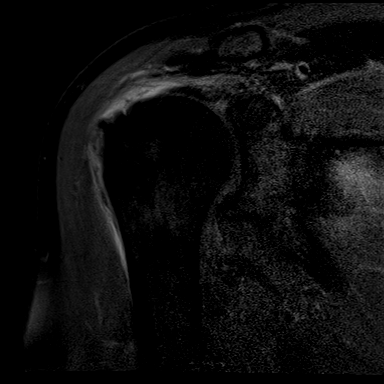
[im 15/26]
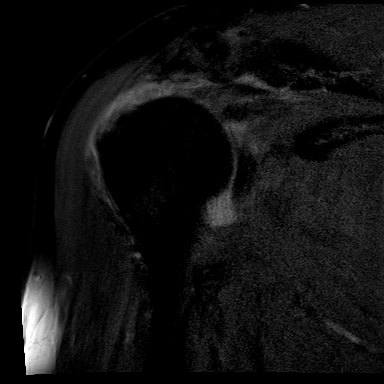
[im 18/26]
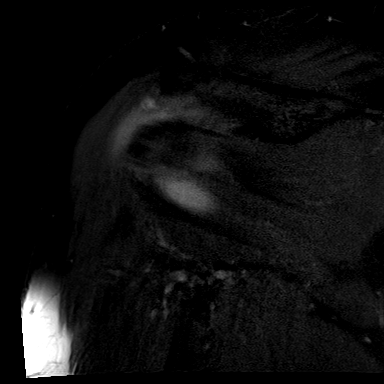
[im 22/26]
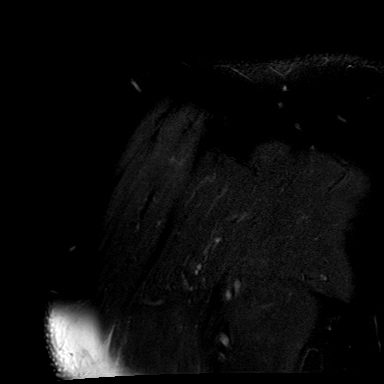
[im 26/26]
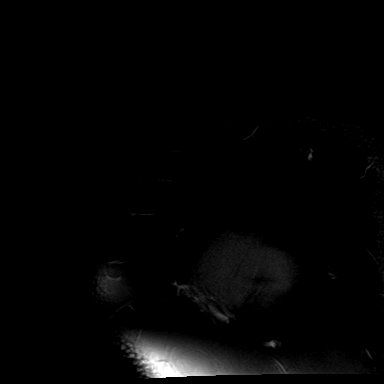

[Series 8: T2 fat-sat · oblique · right · 4.0mm · 0.44mm/px · 8 of 25 slices shown (2 of 2)]
[im 1/25]
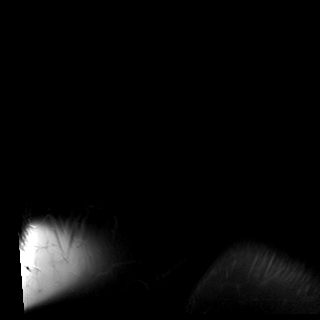
[im 4/25]
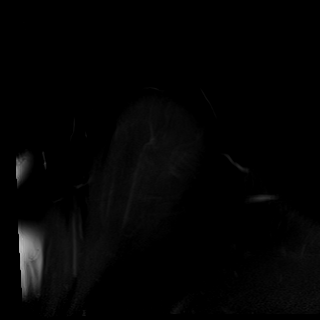
[im 7/25]
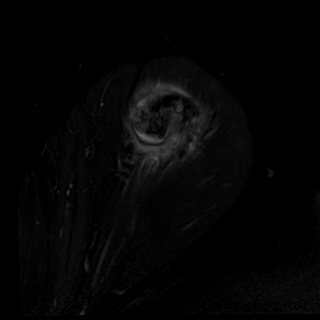
[im 11/25]
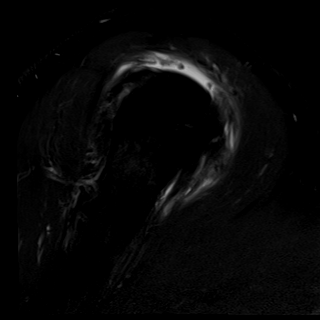
[im 14/25]
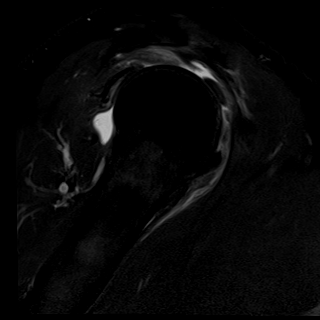
[im 18/25]
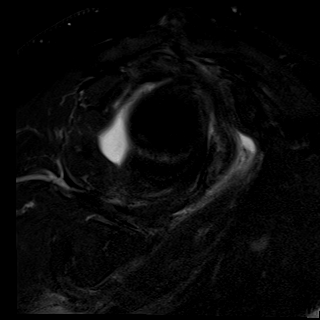
[im 21/25]
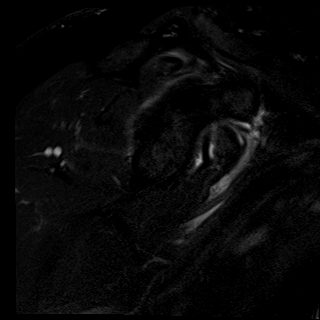
[im 25/25]
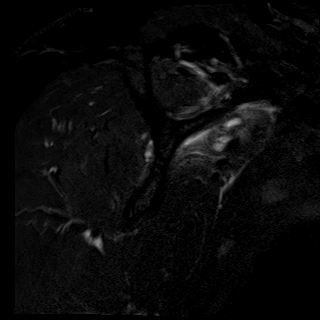

[Series 9: T1 · oblique · right · 4.0mm · 0.36mm/px · 6 of 25 slices shown]
[im 1/25]
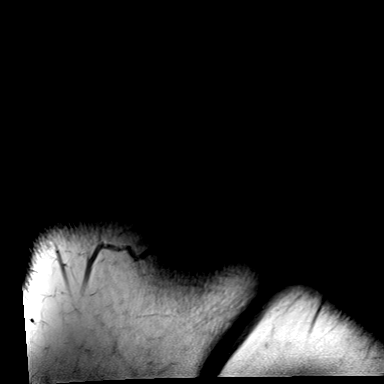
[im 4/25]
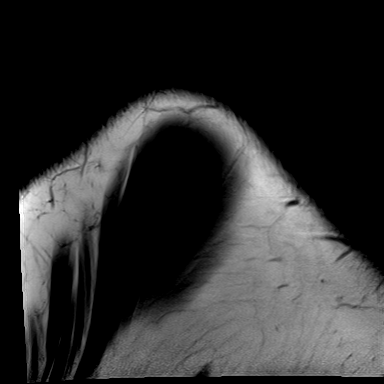
[im 7/25]
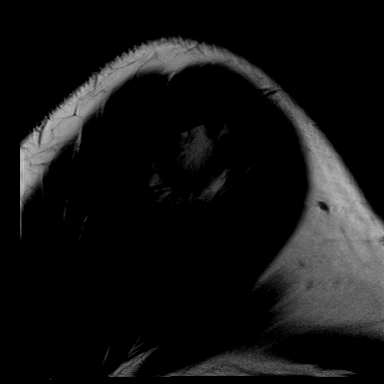
[im 11/25]
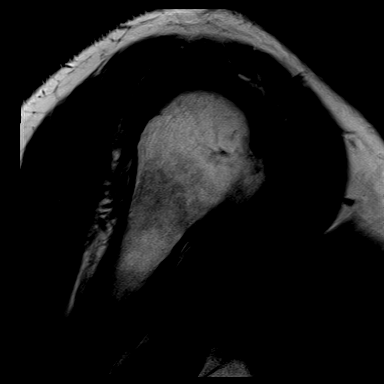
[im 14/25]
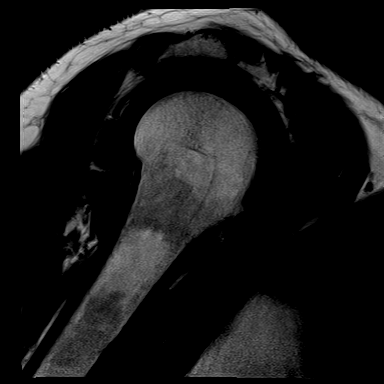
[im 18/25]
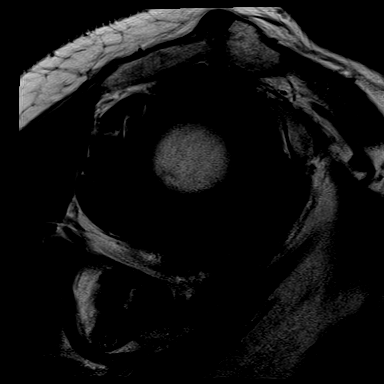

[38 of 40 positions shown; findings below may reference images not displayed]

FINDINGS: Rotator cuff: Full-thickness, full width tear of the supraspinatus
tendon with 3.4 cm retraction to the medial humeral head.
Full-thickness, partial width tear of the superior subscapularis
tendon with 1.5 cm retraction. The infraspinatus and teres minor
tendons are intact.

Muscles: No atrophy or abnormal signal of the muscles of the rotator
cuff.

Biceps long head: Intact and normally positioned. Mild
intra-articular tendinosis.

Acromioclavicular Joint: Moderate arthropathy of the
acromioclavicular joint. Type II acromion. Small amount of fluid and
synovitis in the subacromial/subdeltoid bursa.

Glenohumeral Joint: Mild diffuse chondral thinning without focal
defect. Small joint effusion.

Labrum:  Grossly intact.

Bones:  No marrow abnormality, fracture or dislocation.

Other: None.
IMPRESSION: 1. Full-thickness, full width tear of the supraspinatus tendon with
3.4 cm retraction. No atrophy.
2. Full-thickness, partial width tear of the superior subscapularis
tendon with 1.5 cm retraction. No atrophy.

## 2020-05-14 ENCOUNTER — Other Ambulatory Visit: Payer: Self-pay

## 2020-05-14 ENCOUNTER — Other Ambulatory Visit
Admission: RE | Admit: 2020-05-14 | Discharge: 2020-05-14 | Disposition: A | Discharge status: Home / Self Care | Payer: Federal, State, Local not specified - PPO | Source: Ambulatory Visit | Attending: Internal Medicine | Admitting: Internal Medicine

## 2020-05-14 DIAGNOSIS — Z20822 Contact with and (suspected) exposure to covid-19: Secondary | ICD-10-CM | POA: Diagnosis not present

## 2020-05-14 DIAGNOSIS — Z01812 Encounter for preprocedural laboratory examination: Secondary | ICD-10-CM | POA: Diagnosis present

## 2020-05-14 LAB — SARS CORONAVIRUS 2 (TAT 6-24 HRS): SARS Coronavirus 2: NEGATIVE

## 2020-06-18 ENCOUNTER — Other Ambulatory Visit: Payer: Federal, State, Local not specified - PPO

## 2020-06-22 ENCOUNTER — Ambulatory Visit: Admit: 2020-06-22 | Payer: Federal, State, Local not specified - PPO | Admitting: Internal Medicine

## 2020-06-22 SURGERY — COLONOSCOPY
Anesthesia: General

## 2020-10-26 ENCOUNTER — Other Ambulatory Visit
Admission: RE | Admit: 2020-10-26 | Discharge: 2020-10-26 | Disposition: A | Discharge status: Home / Self Care | Payer: Federal, State, Local not specified - PPO | Source: Other Acute Inpatient Hospital | Attending: Student | Admitting: Student

## 2020-10-26 DIAGNOSIS — R42 Dizziness and giddiness: Secondary | ICD-10-CM | POA: Diagnosis not present

## 2020-10-26 DIAGNOSIS — R053 Chronic cough: Secondary | ICD-10-CM | POA: Insufficient documentation

## 2020-10-26 DIAGNOSIS — R61 Generalized hyperhidrosis: Secondary | ICD-10-CM | POA: Insufficient documentation

## 2020-10-26 LAB — D-DIMER, QUANTITATIVE: D-Dimer, Quant: 0.57 ug/mL-FEU — ABNORMAL HIGH (ref 0.00–0.50)

## 2020-10-26 LAB — TROPONIN I (HIGH SENSITIVITY): Troponin I (High Sensitivity): 9 ng/L (ref <18)

## 2021-10-19 ENCOUNTER — Other Ambulatory Visit
Admission: RE | Admit: 2021-10-19 | Discharge: 2021-10-19 | Disposition: A | Discharge status: Home / Self Care | Payer: Federal, State, Local not specified - PPO | Source: Ambulatory Visit | Attending: Internal Medicine | Admitting: Internal Medicine

## 2021-10-19 DIAGNOSIS — F3132 Bipolar disorder, current episode depressed, moderate: Secondary | ICD-10-CM | POA: Insufficient documentation

## 2021-10-19 LAB — LITHIUM LEVEL: Lithium Lvl: <0.06 mmol/L — ABNORMAL LOW (ref 0.60–1.20)

## 2022-04-22 ENCOUNTER — Other Ambulatory Visit
Admission: RE | Admit: 2022-04-22 | Discharge: 2022-04-22 | Disposition: A | Discharge status: Home / Self Care | Payer: Federal, State, Local not specified - PPO | Source: Ambulatory Visit | Attending: Internal Medicine | Admitting: Internal Medicine

## 2022-04-22 DIAGNOSIS — F3132 Bipolar disorder, current episode depressed, moderate: Secondary | ICD-10-CM | POA: Insufficient documentation

## 2022-04-22 LAB — LITHIUM LEVEL: Lithium Lvl: <0.06 mmol/L — ABNORMAL LOW (ref 0.60–1.20)

## 2022-04-27 ENCOUNTER — Other Ambulatory Visit: Payer: Self-pay | Admitting: General Surgery

## 2022-04-27 DIAGNOSIS — N63 Unspecified lump in unspecified breast: Secondary | ICD-10-CM

## 2022-05-13 ENCOUNTER — Ambulatory Visit
Admission: RE | Admit: 2022-05-13 | Discharge: 2022-05-13 | Disposition: A | Discharge status: Home / Self Care | Payer: Federal, State, Local not specified - PPO | Source: Ambulatory Visit | Attending: General Surgery | Admitting: General Surgery

## 2022-05-13 DIAGNOSIS — N63 Unspecified lump in unspecified breast: Secondary | ICD-10-CM | POA: Insufficient documentation
